# Patient Record
Sex: Male | Born: 1965 | Race: White | Hispanic: No | Marital: Single | Smoking: Current every day smoker
Health system: Southern US, Community
[De-identification: ages and names within clinical notes are randomized; demographics above are authoritative.]

## PROBLEM LIST (undated history)

## (undated) DIAGNOSIS — I10 Essential (primary) hypertension: Secondary | ICD-10-CM

## (undated) DIAGNOSIS — J189 Pneumonia, unspecified organism: Secondary | ICD-10-CM

## (undated) DIAGNOSIS — J984 Other disorders of lung: Secondary | ICD-10-CM

## (undated) DIAGNOSIS — I5020 Unspecified systolic (congestive) heart failure: Secondary | ICD-10-CM

## (undated) DIAGNOSIS — J45909 Unspecified asthma, uncomplicated: Secondary | ICD-10-CM

## (undated) DIAGNOSIS — E785 Hyperlipidemia, unspecified: Secondary | ICD-10-CM

## (undated) DIAGNOSIS — I2510 Atherosclerotic heart disease of native coronary artery without angina pectoris: Secondary | ICD-10-CM

## (undated) DIAGNOSIS — Z720 Tobacco use: Secondary | ICD-10-CM

## (undated) DIAGNOSIS — I255 Ischemic cardiomyopathy: Secondary | ICD-10-CM

## (undated) HISTORY — DX: Pneumonia, unspecified organism: J18.9

## (undated) HISTORY — PX: OTHER SURGICAL HISTORY: SHX169

## (undated) HISTORY — DX: Atherosclerotic heart disease of native coronary artery without angina pectoris: I25.10

## (undated) HISTORY — DX: Tobacco use: Z72.0

## (undated) HISTORY — DX: Unspecified systolic (congestive) heart failure: I50.20

## (undated) HISTORY — DX: Hyperlipidemia, unspecified: E78.5

## (undated) HISTORY — DX: Ischemic cardiomyopathy: I25.5

## (undated) HISTORY — DX: Other disorders of lung: J98.4

## (undated) HISTORY — DX: Essential (primary) hypertension: I10

---

## 2016-03-01 ENCOUNTER — Emergency Department (HOSPITAL_COMMUNITY)
Admission: EM | Admit: 2016-03-01 | Discharge: 2016-03-01 | Disposition: A | Discharge status: Home / Self Care | Payer: Self-pay | Attending: Emergency Medicine | Admitting: Emergency Medicine

## 2016-03-01 ENCOUNTER — Encounter (HOSPITAL_COMMUNITY): Payer: Self-pay

## 2016-03-01 DIAGNOSIS — Z23 Encounter for immunization: Secondary | ICD-10-CM | POA: Insufficient documentation

## 2016-03-01 DIAGNOSIS — Y939 Activity, unspecified: Secondary | ICD-10-CM | POA: Insufficient documentation

## 2016-03-01 DIAGNOSIS — Y999 Unspecified external cause status: Secondary | ICD-10-CM | POA: Insufficient documentation

## 2016-03-01 DIAGNOSIS — Y929 Unspecified place or not applicable: Secondary | ICD-10-CM | POA: Insufficient documentation

## 2016-03-01 DIAGNOSIS — W2203XA Walked into furniture, initial encounter: Secondary | ICD-10-CM | POA: Insufficient documentation

## 2016-03-01 DIAGNOSIS — F17210 Nicotine dependence, cigarettes, uncomplicated: Secondary | ICD-10-CM | POA: Insufficient documentation

## 2016-03-01 DIAGNOSIS — S0191XA Laceration without foreign body of unspecified part of head, initial encounter: Secondary | ICD-10-CM

## 2016-03-01 DIAGNOSIS — S0181XA Laceration without foreign body of other part of head, initial encounter: Secondary | ICD-10-CM | POA: Insufficient documentation

## 2016-03-01 MED ORDER — BACITRACIN ZINC 500 UNIT/GM EX OINT: 1.0000 "application " | TOPICAL_OINTMENT | Freq: Two times a day (BID) | CUTANEOUS | 0 refills | Status: DC

## 2016-03-01 MED ORDER — STERILE WATER FOR INJECTION IJ SOLN
INTRAMUSCULAR | Status: AC
  Filled 2016-03-01: qty 10

## 2016-03-01 MED ORDER — LIDOCAINE-EPINEPHRINE (PF) 2 %-1:200000 IJ SOLN
20.0000 mL | Freq: Once | INTRAMUSCULAR | Status: AC
  Administered 2016-03-01: 20 mL via INTRADERMAL
  Filled 2016-03-01: qty 20

## 2016-03-01 MED ORDER — TETANUS-DIPHTH-ACELL PERTUSSIS 5-2.5-18.5 LF-MCG/0.5 IM SUSP
0.5000 mL | Freq: Once | INTRAMUSCULAR | Status: AC
  Administered 2016-03-01: 0.5 mL via INTRAMUSCULAR
  Filled 2016-03-01: qty 0.5
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
## 2016-03-01 NOTE — ED Notes (Signed)
Patient given discharge papers as he was walking out; told to follow up; did not want to wait for bacitracin application, ambulatory from department with family members.

## 2018-03-15 ENCOUNTER — Emergency Department: Payer: Self-pay

## 2018-03-15 ENCOUNTER — Other Ambulatory Visit: Payer: Self-pay

## 2018-03-15 ENCOUNTER — Encounter: Payer: Self-pay | Admitting: Emergency Medicine

## 2018-03-15 ENCOUNTER — Inpatient Hospital Stay
Admission: EM | Admit: 2018-03-15 | Discharge: 2018-03-19 | DRG: 246 | Disposition: A | Discharge status: Home / Self Care | Payer: Self-pay | Attending: Internal Medicine | Admitting: Internal Medicine

## 2018-03-15 DIAGNOSIS — R7989 Other specified abnormal findings of blood chemistry: Secondary | ICD-10-CM

## 2018-03-15 DIAGNOSIS — I25110 Atherosclerotic heart disease of native coronary artery with unstable angina pectoris: Secondary | ICD-10-CM | POA: Diagnosis present

## 2018-03-15 DIAGNOSIS — J984 Other disorders of lung: Secondary | ICD-10-CM

## 2018-03-15 DIAGNOSIS — E785 Hyperlipidemia, unspecified: Secondary | ICD-10-CM | POA: Diagnosis present

## 2018-03-15 DIAGNOSIS — E876 Hypokalemia: Secondary | ICD-10-CM | POA: Diagnosis present

## 2018-03-15 DIAGNOSIS — R739 Hyperglycemia, unspecified: Secondary | ICD-10-CM | POA: Diagnosis present

## 2018-03-15 DIAGNOSIS — Z720 Tobacco use: Secondary | ICD-10-CM | POA: Diagnosis present

## 2018-03-15 DIAGNOSIS — D649 Anemia, unspecified: Secondary | ICD-10-CM | POA: Diagnosis present

## 2018-03-15 DIAGNOSIS — F17210 Nicotine dependence, cigarettes, uncomplicated: Secondary | ICD-10-CM | POA: Diagnosis present

## 2018-03-15 DIAGNOSIS — R778 Other specified abnormalities of plasma proteins: Secondary | ICD-10-CM

## 2018-03-15 DIAGNOSIS — I214 Non-ST elevation (NSTEMI) myocardial infarction: Principal | ICD-10-CM

## 2018-03-15 DIAGNOSIS — Z23 Encounter for immunization: Secondary | ICD-10-CM

## 2018-03-15 DIAGNOSIS — Z8249 Family history of ischemic heart disease and other diseases of the circulatory system: Secondary | ICD-10-CM

## 2018-03-15 DIAGNOSIS — E46 Unspecified protein-calorie malnutrition: Secondary | ICD-10-CM | POA: Diagnosis present

## 2018-03-15 DIAGNOSIS — I700 Atherosclerosis of aorta: Secondary | ICD-10-CM | POA: Diagnosis present

## 2018-03-15 DIAGNOSIS — J45909 Unspecified asthma, uncomplicated: Secondary | ICD-10-CM | POA: Diagnosis present

## 2018-03-15 DIAGNOSIS — Z789 Other specified health status: Secondary | ICD-10-CM

## 2018-03-15 DIAGNOSIS — Z77120 Contact with and (suspected) exposure to mold (toxic): Secondary | ICD-10-CM

## 2018-03-15 DIAGNOSIS — J188 Other pneumonia, unspecified organism: Secondary | ICD-10-CM | POA: Diagnosis present

## 2018-03-15 DIAGNOSIS — R079 Chest pain, unspecified: Secondary | ICD-10-CM | POA: Diagnosis present

## 2018-03-15 HISTORY — DX: Unspecified asthma, uncomplicated: J45.909

## 2018-03-15 LAB — BASIC METABOLIC PANEL
ANION GAP: 10 (ref 5–15)
BUN: 10 mg/dL (ref 6–20)
CALCIUM: 10 mg/dL (ref 8.9–10.3)
CO2: 28 mmol/L (ref 22–32)
Chloride: 99 mmol/L (ref 98–111)
Creatinine, Ser: 0.85 mg/dL (ref 0.61–1.24)
Glucose, Bld: 143 mg/dL — ABNORMAL HIGH (ref 70–99)
Potassium: 3.5 mmol/L (ref 3.5–5.1)
SODIUM: 137 mmol/L (ref 135–145)

## 2018-03-15 LAB — CBC
HCT: 42.8 % (ref 39.0–52.0)
Hemoglobin: 14.3 g/dL (ref 13.0–17.0)
MCH: 31.2 pg (ref 26.0–34.0)
MCHC: 33.4 g/dL (ref 30.0–36.0)
MCV: 93.2 fL (ref 80.0–100.0)
NRBC: 0 % (ref 0.0–0.2)
PLATELETS: 545 10*3/uL — AB (ref 150–400)
RBC: 4.59 MIL/uL (ref 4.22–5.81)
RDW: 13.2 % (ref 11.5–15.5)
WBC: 13.6 10*3/uL — ABNORMAL HIGH (ref 4.0–10.5)

## 2018-03-15 LAB — PROTIME-INR
INR: 1.15
PROTHROMBIN TIME: 14.6 s (ref 11.4–15.2)

## 2018-03-15 LAB — TROPONIN I: TROPONIN I: 0.19 ng/mL — AB (ref <0.03)

## 2018-03-15 LAB — LACTIC ACID, PLASMA: Lactic Acid, Venous: 1 mmol/L (ref 0.5–1.9)

## 2018-03-15 LAB — APTT: APTT: 34 s (ref 24–36)

## 2018-03-15 MED ORDER — VANCOMYCIN HCL IN DEXTROSE 1-5 GM/200ML-% IV SOLN
1000.0000 mg | Freq: Two times a day (BID) | INTRAVENOUS | Status: DC
  Administered 2018-03-16 (×2): 1000 mg via INTRAVENOUS
  Filled 2018-03-15 (×3): qty 200

## 2018-03-15 MED ORDER — SODIUM CHLORIDE 0.9 % IV BOLUS
500.0000 mL | Freq: Once | INTRAVENOUS | Status: AC
Start: 2018-03-15 — End: 2018-03-15
  Administered 2018-03-15: 500 mL via INTRAVENOUS

## 2018-03-15 MED ORDER — VANCOMYCIN HCL IN DEXTROSE 1-5 GM/200ML-% IV SOLN
1000.0000 mg | Freq: Once | INTRAVENOUS | Status: AC
  Administered 2018-03-15: 1000 mg via INTRAVENOUS
  Filled 2018-03-15: qty 200

## 2018-03-15 MED ORDER — ASPIRIN 81 MG PO CHEW
324.0000 mg | CHEWABLE_TABLET | Freq: Once | ORAL | Status: AC
Start: 2018-03-15 — End: 2018-03-15
  Administered 2018-03-15: 324 mg via ORAL
  Filled 2018-03-15: qty 4

## 2018-03-15 MED ORDER — IOPAMIDOL (ISOVUE-370) INJECTION 76%
75.0000 mL | Freq: Once | INTRAVENOUS | Status: AC | PRN
  Administered 2018-03-15: 75 mL via INTRAVENOUS

## 2018-03-15 MED ORDER — HEPARIN (PORCINE) IN NACL 100-0.45 UNIT/ML-% IJ SOLN
1700.0000 [IU]/h | INTRAMUSCULAR | Status: DC
  Administered 2018-03-15: 1200 [IU]/h via INTRAVENOUS
  Administered 2018-03-16: 1500 [IU]/h via INTRAVENOUS
  Administered 2018-03-17: 1600 [IU]/h via INTRAVENOUS
  Filled 2018-03-15 (×9): qty 250

## 2018-03-15 MED ORDER — AZITHROMYCIN 500 MG IV SOLR
500.0000 mg | INTRAVENOUS | Status: DC
  Administered 2018-03-15 – 2018-03-17 (×3): 500 mg via INTRAVENOUS
  Filled 2018-03-15 (×5): qty 500

## 2018-03-15 MED ORDER — HEPARIN BOLUS VIA INFUSION
4000.0000 [IU] | Freq: Once | INTRAVENOUS | Status: AC
  Administered 2018-03-15: 4000 [IU] via INTRAVENOUS
  Filled 2018-03-15: qty 4000

## 2018-03-15 MED ORDER — FENTANYL CITRATE (PF) 100 MCG/2ML IJ SOLN
50.0000 ug | INTRAMUSCULAR | Status: DC | PRN
Start: 2018-03-15 — End: 2018-03-16
  Administered 2018-03-15: 50 ug via INTRAVENOUS
  Filled 2018-03-15: qty 2

## 2018-03-15 MED ORDER — SODIUM CHLORIDE 0.9 % IV SOLN
2.0000 g | INTRAVENOUS | Status: DC
  Administered 2018-03-15 – 2018-03-18 (×4): 2 g via INTRAVENOUS
  Filled 2018-03-15: qty 20
  Filled 2018-03-15 (×3): qty 2
  Filled 2018-03-15 (×2): qty 20

## 2018-03-15 MED ORDER — NITROGLYCERIN 0.4 MG SL SUBL
0.4000 mg | SUBLINGUAL_TABLET | SUBLINGUAL | Status: DC | PRN
  Administered 2018-03-15 – 2018-03-17 (×2): 0.4 mg via SUBLINGUAL
  Filled 2018-03-15 (×2): qty 1

## 2018-03-15 MED ORDER — SODIUM CHLORIDE 0.9 % IV SOLN: 2.0000 g | INTRAVENOUS | Status: DC

## 2018-03-15 NOTE — ED Notes (Signed)
Patient transported to CT at this time. 

## 2018-03-15 NOTE — ED Notes (Signed)
Pt refusing additional doses of SL Nitro after 1 tablet. Pt states it did not help his pain at all and is starting to give him a headache. Pt informed that this is a common side effect and we could give him medications for the headache, but pt still refuses to take additional doses.

## 2018-03-15 NOTE — ED Notes (Signed)
Only 1 set of blood cultures collected d/t importance of starting ordered IV antibiotics ASAP.

## 2018-03-15 NOTE — ED Triage Notes (Signed)
Pt reports that he developed mid sternal and epigastric pain that began at 1400 today. He states that it burns, does not radiate anywhere. Had N/V, denies diapheresis, or SOB. He reports that he ate some tums but threw them up.

## 2018-03-15 NOTE — Progress Notes (Signed)
ANTICOAGULATION CONSULT NOTE - Initial Consult  Pharmacy Consult for Heparin  Indication: chest pain/ACS  No Known Allergies  Patient Measurements: Height: 5\' 6"  (167.6 cm) Weight: 145 lb (65.8 kg) IBW/kg (Calculated) : 63.8 Heparin Dosing Weight:  65.8 kg   Vital Signs: Temp: 98.2 F (36.8 C) (11/05 1703) Temp Source: Oral (11/05 1703) BP: 133/94 (11/05 2051) Pulse Rate: 91 (11/05 2051)  Labs: Recent Labs    03/15/18 1705  HGB 14.3  HCT 42.8  PLT 545*  CREATININE 0.85  TROPONINI 0.19*    Estimated Creatinine Clearance: 93.8 mL/min (by C-G formula based on SCr of 0.85 mg/dL).   Medical History: Past Medical History:  Diagnosis Date  . Asthma     Medications:   (Not in a hospital admission)  Assessment: Pharmacy consulted to dose heparin in this 52 year old male with ACS/NSTEMI.   CrCl = 93.8 ml/min No prior anticoag noted.  Goal of Therapy:  Heparin level 0.3-0.7 units/ml Monitor platelets by anticoagulation protocol: Yes   Plan:  Give 4000 units bolus x 1 Start heparin infusion at 1200 units/hr Check anti-Xa level in 6 hours and daily while on heparin Continue to monitor H&H and platelets  Hoang Pettingill D 03/15/2018,9:32 PM

## 2018-03-15 NOTE — ED Provider Notes (Signed)
[facility] Emergency Department Provider Note    First MD Initiated Contact with Patient 03/15/18 1953     (approximate)  I have reviewed the triage vital signs and the nursing notes.   HISTORY  Chief Complaint Chest Pain    HPI Keith Warner is a 52 y.o. male with a history of chronic daily cough presents the ER with worsening midsternal and epigastric discomfort that started around 2:00 today.  Did have some worsening shortness of breath and discomfort when taking a deep inspiration.  Also describes the pain is pressure-like someone is sitting on his chest.  Denies any diaphoresis.  No recent fevers.  Did try taking amoxicillin as he thought that he is developing pneumonia.  Was unable to keep those down  secondary to nausea and vomiting.  States he still having some vague discomfort at this time.  Is never had pain like this before.   Past Medical History:  Diagnosis Date  . Asthma    History reviewed. No pertinent family history. Past Surgical History:  Procedure Laterality Date  . gangrene     There are no active problems to display for this patient.     Prior to Admission medications   Medication Sig Start Date End Date Taking? Authorizing Provider  albuterol (PROVENTIL HFA;VENTOLIN HFA) 108 (90 Base) MCG/ACT inhaler Inhale 1 puff into the lungs every 6 (six) hours as needed for wheezing. 02/27/18  Yes [provider]  bacitracin ointment Apply 1 application topically 2 (two) times daily. Patient not taking: Reported on 03/15/2018 03/01/16   Francoise Ceo, DO    Allergies Patient has no known allergies.    Social History Social History   Tobacco Use  . Smoking status: Current Every Day Smoker    Types: Cigarettes  . Smokeless tobacco: Never Used  Substance Use Topics  . Alcohol use: Yes  . Drug use: Not on file    Review of Systems Patient denies headaches, rhinorrhea, blurry vision, numbness, shortness of  breath, chest pain, edema, cough, abdominal pain, nausea, vomiting, diarrhea, dysuria, fevers, rashes or hallucinations unless otherwise stated above in HPI. ____________________________________________   PHYSICAL EXAM:  VITAL SIGNS: Vitals:   03/15/18 2209 03/15/18 2300  BP: (!) 129/99 102/88  Pulse: 91 81  Resp: 20 18  Temp:    SpO2: 99% 97%    Constitutional: Alert and oriented.  Eyes: Conjunctivae are normal.  Head: Atraumatic. Nose: No congestion/rhinnorhea. Mouth/Throat: Mucous membranes are moist.   Neck: No stridor. Painless ROM.  Cardiovascular: Normal rate, regular rhythm. Grossly normal heart sounds.  Good peripheral circulation. Respiratory: Normal respiratory effort.  No retractions. Lungs CTAB. Gastrointestinal: Soft and nontender. No distention. No abdominal bruits. No CVA tenderness. Genitourinary:  Musculoskeletal: No lower extremity tenderness nor edema.  No joint effusions. Neurologic:  Normal speech and language. No gross focal neurologic deficits are appreciated. No facial droop Skin:  Skin is warm, dry and intact. No rash noted. Psychiatric: Mood and affect are normal. Speech and behavior are normal.  ____________________________________________   LABS (all labs ordered are listed, but only abnormal results are displayed)  Results for orders placed or performed during the hospital encounter of 03/15/18 (from the past 24 hour(s))  Basic metabolic panel     Status: Abnormal   Collection Time: 03/15/18  5:05 PM  Result Value Ref Range   Sodium 137 135 - 145 mmol/L   Potassium 3.5 3.5 - 5.1 mmol/L   Chloride 99 98 - 111  mmol/L   CO2 28 22 - 32 mmol/L   Glucose, Bld 143 (H) 70 - 99 mg/dL   BUN 10 6 - 20 mg/dL   Creatinine, Ser 1.61 0.61 - 1.24 mg/dL   Calcium 09.6 8.9 - 04.5 mg/dL   GFR calc non Af Amer >60 >60 mL/min   GFR calc Af Amer >60 >60 mL/min   Anion gap 10 5 - 15  CBC     Status: Abnormal   Collection Time: 03/15/18  5:05 PM  Result  Value Ref Range   WBC 13.6 (H) 4.0 - 10.5 K/uL   RBC 4.59 4.22 - 5.81 MIL/uL   Hemoglobin 14.3 13.0 - 17.0 g/dL   HCT 40.9 81.1 - 91.4 %   MCV 93.2 80.0 - 100.0 fL   MCH 31.2 26.0 - 34.0 pg   MCHC 33.4 30.0 - 36.0 g/dL   RDW 78.2 95.6 - 21.3 %   Platelets 545 (H) 150 - 400 K/uL   nRBC 0.0 0.0 - 0.2 %  Troponin I     Status: Abnormal   Collection Time: 03/15/18  5:05 PM  Result Value Ref Range   Troponin I 0.19 (HH) <0.03 ng/mL  Lactic acid, plasma     Status: None   Collection Time: 03/15/18  9:28 PM  Result Value Ref Range   Lactic Acid, Venous 1.0 0.5 - 1.9 mmol/L  Protime-INR     Status: None   Collection Time: 03/15/18  9:39 PM  Result Value Ref Range   Prothrombin Time 14.6 11.4 - 15.2 seconds   INR 1.15   APTT     Status: None   Collection Time: 03/15/18  9:39 PM  Result Value Ref Range   aPTT 34 24 - 36 seconds   ____________________________________________  EKG My review and personal interpretation at Time: 16:56   Indication: chest pain  Rate: 95  Rhythm: sinus Axis: normal Other: inferolateral st depression, no stemi, abn ekg ____________________________________________  RADIOLOGY  I personally reviewed all radiographic images ordered to evaluate for the above acute complaints and reviewed radiology reports and findings.  These findings were personally discussed with the patient.  Please see medical record for radiology report.  ____________________________________________   PROCEDURES  Procedure(s) performed:  .Critical Care Performed by: Willy Eddy, MD Authorized by: Willy Eddy, MD   Critical care provider statement:    Critical care time (minutes):  35   Critical care time was exclusive of:  Separately billable procedures and treating other patients   Critical care was necessary to treat or prevent imminent or life-threatening deterioration of the following conditions:  Cardiac failure   Critical care was time spent personally by me on  the following activities:  Development of treatment plan with patient or surrogate, discussions with consultants, evaluation of patient's response to treatment, examination of patient, obtaining history from patient or surrogate, ordering and performing treatments and interventions, ordering and review of laboratory studies, ordering and review of radiographic studies, pulse oximetry, re-evaluation of patient's condition and review of old charts      Critical Care performed: yes ____________________________________________   INITIAL IMPRESSION / ASSESSMENT AND PLAN / ED COURSE  Pertinent labs & imaging results that were available during my care of the patient were reviewed by me and considered in my medical decision making (see chart for details).   DDX: ACS, pericarditis, esophagitis, boerhaaves, pe, dissection, pna, bronchitis, costochondritis   Keith Warner is a 52 y.o. who presents to the ED with symptoms  as described above.  Patient arrives afebrile mildly hypertensive but no respiratory distress.  EKG does show some inferolateral ST changes but no STEMI criteria.  Initial troponin is elevated concerning for an STEMI but does have leukocytosis and given his coughs could be demand ischemia.  Given his significant smoking history with possible cavitary lesion on chest x-ray CT imaging will be ordered to exclude PE and malignancy.  Will provide IV pain medication as well as IV fluids.  Patient will require hospitalization for further medical work-up.  Clinical Course as of Mar 16 2355  Tue Mar 15, 2018  2104 CT angiogram does show evidence of probable cavitary pneumonia.  Will start on broad-spectrum antibiotics.  Do feel troponin elevation more likely demand ischemia in his current setting.   [PR]  2127 I discussed the case with Dr. and of cardiology.  He has recommended continuing the treatment for ACS with aspirin and heparinization the patient will likely require catheterization.   Remains stable at this time.  He is pain-Warner.  Discussed the case with Dr. Anne Hahn of hospitalist group, agrees to admit patient for further medical management.   [PR]    Clinical Course User Index [PR] Willy Eddy, MD     As part of my medical decision making, I reviewed the following data within the electronic MEDICAL RECORD NUMBER Nursing notes reviewed and incorporated, Labs reviewed, notes from prior ED visits.  ____________________________________________   FINAL CLINICAL IMPRESSION(S) / ED DIAGNOSES  Final diagnoses:  Cavitary lesion of lung  Chest pain, unspecified type  Elevated troponin I level      NEW MEDICATIONS STARTED DURING THIS VISIT:  New Prescriptions   No medications on file     Note:  This document was prepared using Dragon voice recognition software and may include unintentional dictation errors.    Willy Eddy, MD 03/15/18 2356

## 2018-03-16 ENCOUNTER — Inpatient Hospital Stay
Admit: 2018-03-16 | Discharge: 2018-03-16 | Disposition: A | Discharge status: Home / Self Care | Payer: Self-pay | Attending: Internal Medicine | Admitting: Internal Medicine

## 2018-03-16 ENCOUNTER — Encounter: Payer: Self-pay | Admitting: Internal Medicine

## 2018-03-16 DIAGNOSIS — F1011 Alcohol abuse, in remission: Secondary | ICD-10-CM

## 2018-03-16 DIAGNOSIS — I214 Non-ST elevation (NSTEMI) myocardial infarction: Principal | ICD-10-CM

## 2018-03-16 DIAGNOSIS — Z720 Tobacco use: Secondary | ICD-10-CM

## 2018-03-16 DIAGNOSIS — J181 Lobar pneumonia, unspecified organism: Secondary | ICD-10-CM

## 2018-03-16 DIAGNOSIS — J45909 Unspecified asthma, uncomplicated: Secondary | ICD-10-CM | POA: Diagnosis present

## 2018-03-16 DIAGNOSIS — R079 Chest pain, unspecified: Secondary | ICD-10-CM | POA: Diagnosis present

## 2018-03-16 DIAGNOSIS — R0602 Shortness of breath: Secondary | ICD-10-CM

## 2018-03-16 DIAGNOSIS — J984 Other disorders of lung: Secondary | ICD-10-CM

## 2018-03-16 DIAGNOSIS — Z77120 Contact with and (suspected) exposure to mold (toxic): Secondary | ICD-10-CM

## 2018-03-16 DIAGNOSIS — F17210 Nicotine dependence, cigarettes, uncomplicated: Secondary | ICD-10-CM

## 2018-03-16 DIAGNOSIS — R918 Other nonspecific abnormal finding of lung field: Secondary | ICD-10-CM

## 2018-03-16 DIAGNOSIS — R0789 Other chest pain: Secondary | ICD-10-CM

## 2018-03-16 LAB — LIPID PANEL
CHOL/HDL RATIO: 4.6 ratio
CHOLESTEROL: 160 mg/dL (ref 0–200)
HDL: 35 mg/dL — AB (ref >40)
LDL Cholesterol: 106 mg/dL — ABNORMAL HIGH (ref 0–99)
Triglycerides: 93 mg/dL (ref <150)
VLDL: 19 mg/dL (ref 0–40)

## 2018-03-16 LAB — HEPATIC FUNCTION PANEL
ALK PHOS: 54 U/L (ref 38–126)
ALT: 11 U/L (ref 0–44)
AST: 36 U/L (ref 15–41)
Albumin: 3 g/dL — ABNORMAL LOW (ref 3.5–5.0)
Total Bilirubin: 1 mg/dL (ref 0.3–1.2)
Total Protein: 7.1 g/dL (ref 6.5–8.1)

## 2018-03-16 LAB — TROPONIN I
TROPONIN I: 5.66 ng/mL — AB (ref <0.03)
TROPONIN I: 6.21 ng/mL — AB (ref <0.03)
TROPONIN I: 4.61 ng/mL — AB (ref <0.03)
Troponin I: 3.82 ng/mL (ref <0.03)

## 2018-03-16 LAB — ECHOCARDIOGRAM COMPLETE
Height: 66 in
Weight: 2163.2 oz

## 2018-03-16 LAB — BASIC METABOLIC PANEL
Anion gap: 10 (ref 5–15)
BUN: 8 mg/dL (ref 6–20)
CALCIUM: 9.2 mg/dL (ref 8.9–10.3)
CHLORIDE: 102 mmol/L (ref 98–111)
CO2: 26 mmol/L (ref 22–32)
Creatinine, Ser: 0.65 mg/dL (ref 0.61–1.24)
Glucose, Bld: 113 mg/dL — ABNORMAL HIGH (ref 70–99)
Potassium: 3.8 mmol/L (ref 3.5–5.1)
SODIUM: 138 mmol/L (ref 135–145)

## 2018-03-16 LAB — CBC
HEMATOCRIT: 37.6 % — AB (ref 39.0–52.0)
Hemoglobin: 12.6 g/dL — ABNORMAL LOW (ref 13.0–17.0)
MCH: 31.1 pg (ref 26.0–34.0)
MCHC: 33.5 g/dL (ref 30.0–36.0)
MCV: 92.8 fL (ref 80.0–100.0)
NRBC: 0 % (ref 0.0–0.2)
PLATELETS: 504 10*3/uL — AB (ref 150–400)
RBC: 4.05 MIL/uL — ABNORMAL LOW (ref 4.22–5.81)
RDW: 13.2 % (ref 11.5–15.5)
WBC: 10.5 10*3/uL (ref 4.0–10.5)

## 2018-03-16 LAB — MRSA PCR SCREENING: MRSA by PCR: NEGATIVE

## 2018-03-16 LAB — HEPARIN LEVEL (UNFRACTIONATED)
HEPARIN UNFRACTIONATED: 0.16 [IU]/mL — AB (ref 0.30–0.70)
HEPARIN UNFRACTIONATED: 0.12 [IU]/mL — AB (ref 0.30–0.70)
Heparin Unfractionated: 0.35 IU/mL (ref 0.30–0.70)
Heparin Unfractionated: 0.2 IU/mL — ABNORMAL LOW (ref 0.30–0.70)

## 2018-03-16 LAB — HEMOGLOBIN A1C
Hgb A1c MFr Bld: 5.9 % — ABNORMAL HIGH (ref 4.8–5.6)
Mean Plasma Glucose: 122.63 mg/dL

## 2018-03-16 LAB — MAGNESIUM: Magnesium: 1.9 mg/dL (ref 1.7–2.4)

## 2018-03-16 MED ORDER — INFLUENZA VAC SPLIT QUAD 0.5 ML IM SUSY
0.5000 mL | PREFILLED_SYRINGE | INTRAMUSCULAR | Status: AC
  Administered 2018-03-17: 0.5 mL via INTRAMUSCULAR
  Filled 2018-03-16: qty 0.5

## 2018-03-16 MED ORDER — ASPIRIN EC 81 MG PO TBEC
81.0000 mg | DELAYED_RELEASE_TABLET | Freq: Every day | ORAL | Status: DC
  Administered 2018-03-16 – 2018-03-19 (×4): 81 mg via ORAL
  Filled 2018-03-16 (×4): qty 1

## 2018-03-16 MED ORDER — ONDANSETRON HCL 4 MG/2ML IJ SOLN: 4.0000 mg | Freq: Four times a day (QID) | INTRAMUSCULAR | Status: DC | PRN

## 2018-03-16 MED ORDER — ACETAMINOPHEN 650 MG RE SUPP: 650.0000 mg | Freq: Four times a day (QID) | RECTAL | Status: DC | PRN

## 2018-03-16 MED ORDER — ALBUTEROL SULFATE (2.5 MG/3ML) 0.083% IN NEBU: 2.5000 mg | INHALATION_SOLUTION | Freq: Four times a day (QID) | RESPIRATORY_TRACT | Status: DC | PRN

## 2018-03-16 MED ORDER — METRONIDAZOLE IN NACL 5-0.79 MG/ML-% IV SOLN
500.0000 mg | Freq: Three times a day (TID) | INTRAVENOUS | Status: DC
  Administered 2018-03-16 – 2018-03-19 (×8): 500 mg via INTRAVENOUS
  Filled 2018-03-16 (×11): qty 100

## 2018-03-16 MED ORDER — PNEUMOCOCCAL VAC POLYVALENT 25 MCG/0.5ML IJ INJ
0.5000 mL | INJECTION | INTRAMUSCULAR | Status: AC
  Administered 2018-03-17: 0.5 mL via INTRAMUSCULAR
  Filled 2018-03-16: qty 0.5

## 2018-03-16 MED ORDER — ACETAMINOPHEN 325 MG PO TABS: 650.0000 mg | ORAL_TABLET | Freq: Four times a day (QID) | ORAL | Status: DC | PRN

## 2018-03-16 MED ORDER — HEPARIN BOLUS VIA INFUSION
2000.0000 [IU] | Freq: Once | INTRAVENOUS | Status: AC
  Administered 2018-03-16: 2000 [IU] via INTRAVENOUS
  Filled 2018-03-16: qty 2000

## 2018-03-16 MED ORDER — ONDANSETRON HCL 4 MG PO TABS: 4.0000 mg | ORAL_TABLET | Freq: Four times a day (QID) | ORAL | Status: DC | PRN

## 2018-03-16 MED ORDER — POTASSIUM CHLORIDE CRYS ER 10 MEQ PO TBCR
10.0000 meq | EXTENDED_RELEASE_TABLET | Freq: Two times a day (BID) | ORAL | Status: AC
  Administered 2018-03-16: 10 meq via ORAL
  Filled 2018-03-16: qty 1

## 2018-03-16 MED ORDER — HEPARIN BOLUS VIA INFUSION
1800.0000 [IU] | Freq: Once | INTRAVENOUS | Status: AC
  Administered 2018-03-16: 1800 [IU] via INTRAVENOUS
  Filled 2018-03-16: qty 1800

## 2018-03-16 NOTE — Consult Note (Addendum)
[facility] [place] [facility] Medicine Consultation      Assessment and Plan:  Right upper lobe cavitary pneumonia. - Appeared to coincide with working removing mold underneath a home. Suspicious for necrotizing pneumonia, possible atypical and fungal pneumonia are also in the differential.. --Given patient's elevation and troponins, with an NSTEMI, patient patient would not be a candidate for bronchoscopy at this time. --Will check TB quantiferon, sputum AFB. - Continue empiric antibiotic treatment. - Patient will need further outpatient imaging follow-up to ensure resolution.  Chest pain.  --Continue cardiac workup.  Nicotine abuse. -Currently smoking 1 pack/day, however given this recent episode, he is considering quitting.  Discussed importance of smoke cessation.   Date: 03/16/2018  MRN# [id number] Keith Warner [birth year]  Referring Physician: Dr. Cherlynn Kaiser for pneumonia.   Keith Warner is a 52 y.o. old male seen in consultation for chief complaint of:    Chief Complaint  Patient presents with  . Chest Pain    HPI:  Patient is a 52 year old male presents with progressive midsternal comfort and chronic cough.  He was taking Tums due to chest discomfort and presumed heartburn.  Upon arrival in the ED he was given a sublingual nitroglycerin with some improvement.  He does report intermittent night sweats and fatigue for the last 1 month, he has been working underneath his uncles house cleaning out mold for the last month. Troponin was elevated in ED at 0.19.  Subsequently increased to 5.66.  CT chest 03/15/2018>> imaging personally reviewed, there is a cavitary mass seen in the right upper lobe with surrounding area of infiltrate.   PMHX:   Past Medical History:  Diagnosis Date  . Asthma    Surgical Hx:  Past Surgical History:  Procedure Laterality Date  . gangrene     Family Hx:  Family History  Problem Relation Age of Onset  . CAD Mother    Social Hx:     Social History   Tobacco Use  . Smoking status: Current Every Day Smoker    Packs/day: 1.00    Years: 35.00    Pack years: 35.00    Types: Cigarettes  . Smokeless tobacco: Never Used  Substance Use Topics  . Alcohol use: Yes  . Drug use: Not on file   Medication:    Current Facility-Administered Medications:  .  acetaminophen (TYLENOL) tablet 650 mg, 650 mg, Oral, Q6H PRN **OR** acetaminophen (TYLENOL) suppository 650 mg, 650 mg, Rectal, Q6H PRN, Oralia Manis, MD .  albuterol (PROVENTIL) (2.5 MG/3ML) 0.083% nebulizer solution 2.5 mg, 2.5 mg, Inhalation, Q6H PRN, Oralia Manis, MD .  aspirin EC tablet 81 mg, 81 mg, Oral, Daily, Oralia Manis, MD, 81 mg at 03/16/18 1130 .  azithromycin (ZITHROMAX) 500 mg in sodium chloride 0.9 % 250 mL IVPB, 500 mg, Intravenous, Q24H, Oralia Manis, MD, Stopped at 03/16/18 0026 .  cefTRIAXone (ROCEPHIN) 2 g in sodium chloride 0.9 % 100 mL IVPB, 2 g, Intravenous, Q24H, Oralia Manis, MD, Stopped at 03/15/18 2222 .  heparin ADULT infusion 100 units/mL (25000 units/266mL sodium chloride 0.45%), 1,500 Units/hr, Intravenous, Continuous, Ellington, Abby K, RPH, Last Rate: 15 mL/hr at 03/16/18 1131, 1,500 Units/hr at 03/16/18 1131 .  [START ON 03/17/2018] Influenza vac split quadrivalent PF (FLUARIX) injection 0.5 mL, 0.5 mL, Intramuscular, Tomorrow-1000, Oralia Manis, MD .  nitroGLYCERIN (NITROSTAT) SL tablet 0.4 mg, 0.4 mg, Sublingual, Q5 min PRN, Oralia Manis, MD, 0.4 mg at 03/15/18 2208 .  ondansetron (ZOFRAN) tablet 4 mg, 4 mg, Oral, Q6H PRN **OR**  ondansetron (ZOFRAN) injection 4 mg, 4 mg, Intravenous, Q6H PRN, Oralia Manis, MD .  Melene Muller ON 03/17/2018] pneumococcal 23 valent vaccine (PNU-IMMUNE) injection 0.5 mL, 0.5 mL, Intramuscular, Tomorrow-1000, Oralia Manis, MD .  potassium chloride (K-DUR,KLOR-CON) CR tablet 10 mEq, 10 mEq, Oral, BID, Visser, Jacquelyn D, PA-C .  vancomycin (VANCOCIN) IVPB 1000 mg/200 mL premix, 1,000 mg, Intravenous, Q12H,  Oralia Manis, MD, Last Rate: 200 mL/hr at 03/16/18 0414, 1,000 mg at 03/16/18 0414   Allergies:  Patient has no known allergies.  Review of Systems: Gen:  Denies  fever, sweats, chills HEENT: Denies blurred vision, double vision. bleeds, sore throat Cvc:  No dizziness, chest pain. Resp:   Denies cough or sputum production, shortness of breath Gi: Denies swallowing difficulty, stomach pain. Gu:  Denies bladder incontinence, burning urine Ext:   No Joint pain, stiffness. Skin: No skin rash,  hives  Endoc:  No polyuria, polydipsia. Psych: No depression, insomnia. Other:  All other systems were reviewed with the patient and were negative other that what is mentioned in the HPI.   Physical Examination:   VS: BP 107/82 (BP Location: Right Arm)   Pulse 86   Temp 98.5 F (36.9 C) (Oral)   Resp 19   Ht 5\' 6"  (1.676 m)   Wt 61.3 kg   SpO2 97%   BMI 21.82 kg/m   General Appearance: No distress  Neuro:without focal findings,  speech normal,  HEENT: PERRLA, EOM intact.   Pulmonary: normal breath sounds, No wheezing.  Creased air entry in right apex. CardiovascularNormal S1,S2.  No m/r/g.   Abdomen: Benign, Soft, non-tender. Renal:  No costovertebral tenderness  GU:  No performed at this time. Endoc: No evident thyromegaly, no signs of acromegaly. Skin:   warm, no rashes, no ecchymosis  Extremities: normal, no cyanosis, clubbing.  Other findings:    LABORATORY PANEL:   CBC Recent Labs  Lab 03/16/18 0224  WBC 10.5  HGB 12.6*  HCT 37.6*  PLT 504*   ------------------------------------------------------------------------------------------------------------------  Chemistries  Recent Labs  Lab 03/16/18 0224  NA 138  K 3.8  CL 102  CO2 26  GLUCOSE 113*  BUN 8  CREATININE 0.65  CALCIUM 9.2   ------------------------------------------------------------------------------------------------------------------  Cardiac Enzymes Recent Labs  Lab 03/16/18 0935   TROPONINI 6.21*   ------------------------------------------------------------  RADIOLOGY:  Dg Chest 2 View  Result Date: 03/15/2018 CLINICAL DATA:  Initial evaluation for acute midsternal chest pain. EXAM: CHEST - 2 VIEW COMPARISON:  None. FINDINGS: Cardiac and mediastinal silhouettes are within normal limits. Lungs well inflated. There is an irregular nodular opacity overlying the right suprahilar region measuring approximately 3.7 cm in size. Cavitary change seen just distally within the right lung apex. Irregular architectural distortion present within this region. Irregular curvilinear scarring at the left infrahilar region lungs are otherwise clear. No other focal airspace disease. No pulmonary edema or definite pleural effusion. No pneumothorax. No acute osseous abnormality. IMPRESSION: 3.7 cm nodular density with adjacent cavitary change at the right lung apex, indeterminate. Further assessment with dedicated cross-sectional imaging recommended. Electronically Signed   By: Rise Mu M.D.   On: 03/15/2018 17:49   Ct Angio Chest Pe W And/or Wo Contrast  Result Date: 03/15/2018 CLINICAL DATA:  52 year old male with mid sternal and epigastric pain since 2 p.m. today. Abnormal chest x-ray. EXAM: CT ANGIOGRAPHY CHEST WITH CONTRAST TECHNIQUE: Multidetector CT imaging of the chest was performed using the standard protocol during bolus administration of intravenous contrast. Multiplanar CT image reconstructions and MIPs were  obtained to evaluate the vascular anatomy. CONTRAST:  75mL ISOVUE-370 IOPAMIDOL (ISOVUE-370) INJECTION 76% COMPARISON:  None. FINDINGS: Cardiovascular: No filling defects are noted in the pulmonary arterial tree to suggest underlying pulmonary embolism. Heart size is normal. There is no significant pericardial fluid, thickening or pericardial calcification. There is aortic atherosclerosis, as well as atherosclerosis of the great vessels of the mediastinum and the coronary  arteries, including calcified atherosclerotic plaque in the left anterior descending and left circumflex coronary arteries. Mediastinum/Nodes: No pathologically enlarged mediastinal or hilar lymph nodes. Right hilar lymph nodes measuring up to 9 mm in short axis (nonspecific). Esophagus is unremarkable in appearance. No axillary lymphadenopathy. Lungs/Pleura: In the posterior aspect of the right upper lobe there are multiple cavitary areas clustered in one region, which demonstrate thick walls, concerning for multifocal cavitary pneumonia. The possibility of cavitary mass is not excluded, but not strongly favored. Adjacent to these lesions there are some areas of peribronchovascular micro nodularity and ground-glass attenuation. Focal area of scarring in the superior segment of the left lower lobe. No pleural effusions. Upper Abdomen: Unremarkable. Musculoskeletal: There are no aggressive appearing lytic or blastic lesions noted in the visualized portions of the skeleton. Review of the MIP images confirms the above findings. IMPRESSION: 1. Findings are favored to reflect cavitary pneumonia in the right upper lobe, as above. Close attention after antimicrobial therapy is recommended to ensure complete resolution of these findings, as the possibility of a cavitary neoplasm is not excluded (but not favored at this time). 2. No evidence of pulmonary embolism. 3. Aortic atherosclerosis, in addition to 2 vessel coronary artery disease. Please note that although the presence of coronary artery calcium documents the presence of coronary artery disease, the severity of this disease and any potential stenosis cannot be assessed on this non-gated CT examination. Assessment for potential risk factor modification, dietary therapy or pharmacologic therapy may be warranted, if clinically indicated. Aortic Atherosclerosis (ICD10-I70.0). Electronically Signed   By: Trudie Reed M.D.   On: 03/15/2018 21:00       Thank  you  for the consultation and for allowing [facility] [place] [facility] to assist in the care of your patient. Our recommendations are noted above.  Please contact us if we can be of further service.   Wells Guiles, M.D., F.C.C.P.  Board Certified in Internal Medicine, Pulmonary Medicine, Critical Care Medicine, and Sleep Medicine.  [facility] Office Number: [phone]   03/16/2018

## 2018-03-16 NOTE — Consult Note (Signed)
NAME: Keith Warner  DOB: 09/08/1965  MRN: [id number]  Date/Time: 03/16/2018 1:12 PM  Keith Warner Subjective:  REASON FOR CONSULT: rt upper lobe cavity ? Keith Warner is a 52 y.o. male smoker, h/o heavy alcohol use is admitted with burning sensation in the chest which did not subside with tums and he vomited and then called EMS. He was given NG with relief of pain . In the ED CXR revealed a rt upper lobe cavity and CT chest confirmed it. He was diagnosed with NSTEMI and on heparin drip. 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 He wore a respirator while working. He developed cough, chills, fatigue and night sweats. He went to his PCP and was given steroids and inhalers which made him feel better for a few days. 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 He is a smoker ( 1 PPD) he used to drink heavily until a year ago when he quit completely. He does not specifically remember choking on any food  No recent travel, activities like swimming, sauna, white water rafting etc. No pets. No TB exposure  Past Medical History:  Diagnosis Date  . Asthma     Past Surgical History:  Procedure Laterality Date  . gangrene      SH Smoker Ex alcohol abuse Long time ago used marijuana and hard drugs  Family History  Problem Relation Age of Onset  . CAD Mother    No Known Allergies  ? Current Facility-Administered Medications  Medication Dose Route Frequency Provider Last Rate Last Dose  . acetaminophen (TYLENOL) tablet 650 mg  650 mg Oral Q6H PRN Keith Manis, MD       Or  . acetaminophen (TYLENOL) suppository 650 mg  650 mg Rectal Q6H PRN Keith Manis, MD      . albuterol (PROVENTIL) (2.5 MG/3ML) 0.083% nebulizer solution 2.5 mg  2.5 mg Inhalation Q6H PRN Keith Manis, MD      . aspirin EC tablet 81  mg  81 mg Oral Daily Keith Manis, MD   81 mg at 03/16/18 1130  . azithromycin (ZITHROMAX) 500 mg in sodium chloride 0.9 % 250 mL IVPB  500 mg Intravenous Q24H Keith Manis, MD   Stopped at 03/16/18 0026  . cefTRIAXone (ROCEPHIN) 2 g in sodium chloride 0.9 % 100 mL IVPB  2 g Intravenous Q24H Keith Manis, MD   Stopped at 03/15/18 2222  . heparin ADULT infusion 100 units/mL (25000 units/24mL sodium chloride 0.45%)  1,500 Units/hr Intravenous Continuous Pricilla Riffle, RPH 15 mL/hr at 03/16/18 1256 1,500 Units/hr at 03/16/18 1256  . [START ON 03/17/2018] Influenza vac split quadrivalent PF (FLUARIX) injection 0.5 mL  0.5 mL Intramuscular Tomorrow-1000 Keith Manis, MD      . nitroGLYCERIN (NITROSTAT) SL tablet 0.4 mg  0.4 mg Sublingual Q5 min PRN Keith Manis, MD   0.4 mg at 03/15/18 2208  . ondansetron (ZOFRAN) tablet 4 mg  4 mg Oral Q6H PRN Keith Manis, MD       Or  . ondansetron [facility]) injection 4 mg  4 mg Intravenous Q6H PRN Keith Manis, MD      . Melene Muller ON 03/17/2018] pneumococcal 23 valent vaccine (PNU-IMMUNE) injection 0.5 mL  0.5 mL Intramuscular Tomorrow-1000 Keith Manis, MD      .  potassium chloride (K-DUR,KLOR-CON) CR tablet 10 mEq  10 mEq Oral BID Michaelle Birks, Jacquelyn D, PA-C      . vancomycin (VANCOCIN) IVPB 1000 mg/200 mL premix  1,000 mg Intravenous Rigoberto Noel, MD 200 mL/hr at 03/16/18 0414 1,000 mg at 03/16/18 0414     Abtx:  Anti-infectives (From admission, onward)   Start     Dose/Rate Route Frequency Ordered Stop   03/16/18 2200  cefTRIAXone (ROCEPHIN) 2 g in sodium chloride 0.9 % 100 mL IVPB  Status:  Discontinued     2 g 200 mL/hr over 30 Minutes Intravenous Every 24 hours 03/15/18 2201 03/16/18 0308   03/16/18 0400  vancomycin (VANCOCIN) IVPB 1000 mg/200 mL premix     1,000 mg 200 mL/hr over 60 Minutes Intravenous Every 12 hours 03/15/18 2201     03/15/18 2115  vancomycin (VANCOCIN) IVPB 1000 mg/200 mL premix     1,000 mg 200 mL/hr over 60 Minutes  Intravenous  Once 03/15/18 2104 03/15/18 2315   03/15/18 2115  cefTRIAXone (ROCEPHIN) 2 g in sodium chloride 0.9 % 100 mL IVPB     2 g 200 mL/hr over 30 Minutes Intravenous Every 24 hours 03/15/18 2104     03/15/18 2115  azithromycin (ZITHROMAX) 500 mg in sodium chloride 0.9 % 250 mL IVPB     500 mg 250 mL/hr over 60 Minutes Intravenous Every 24 hours 03/15/18 2104        REVIEW OF SYSTEMS:  Const:  fever, chills, no significant  weight loss Eyes: negative diplopia or visual changes, negative eye pain ENT: negative coryza, negative sore throat Resp:  cough, No hemoptysis, dyspnea Cards:  chest pain, NO palpitations, lower extremity edema GU: negative for frequency, dysuria and hematuria GI: Negative for abdominal apin, diarrhea, bleeding, constipation Skin: negative for rash and pruritus Heme: negative for easy bruising and gum/nose bleeding MS: generalized fatigue and weakness Neurolo:negative for headaches, dizziness, vertigo, memory problems  Psych: negative for feelings of anxiety, depression  Endocrine: negative for thyroid, diabetes Allergy/Immunology- negative for any medication or food allergies ? Pertinent Positives include : Objective:  VITALS:  BP 107/82 (BP Location: Right Arm)   Pulse 86   Temp 98.5 F (36.9 C) (Oral)   Resp 19   Ht 5\' 6"  (1.676 m)   Wt 61.3 kg   SpO2 97%   BMI 21.82 kg/m  PHYSICAL EXAM:  General: Alert, cooperative, no distress, appears stated age.  Head: Normocephalic, without obvious abnormality, atraumatic. Eyes: Conjunctivae clear, anicteric sclerae. Pupils are equal ENT Nares normal. No drainage or sinus tenderness. Lips, mucosa, and tongue normal. No Thrush Neck: Supple, symmetrical, no adenopathy, thyroid: non tender no carotid bruit and no JVD. Back: No CVA tenderness. Lungs: Clear to auscultation bilaterally. No Wheezing or Rhonchi. No rales. Heart: Regular rate and rhythm, no murmur, rub or gallop. Abdomen: Soft,  non-tender,not distended. Bowel sounds normal. No masses Extremities: atraumatic, no cyanosis. No edema. No clubbing Skin: No rashes or lesions. Or bruising Lymph: Cervical, supraclavicular normal. Neurologic: Grossly non-focal Pertinent Labs Lab Results CBC    Component Value Date/Time   WBC 10.5 03/16/2018 0224   RBC 4.05 (L) 03/16/2018 0224   HGB 12.6 (L) 03/16/2018 0224   HCT 37.6 (L) 03/16/2018 0224   PLT 504 (H) 03/16/2018 0224   MCV 92.8 03/16/2018 0224   MCH 31.1 03/16/2018 0224   MCHC 33.5 03/16/2018 0224   RDW 13.2 03/16/2018 0224    CMP Latest Ref Rng & Units 03/16/2018 03/15/2018  Glucose 70 - 99 mg/dL 299(B) 716(R)  BUN 6 - 20 mg/dL 8 10  Creatinine 6.78 - 1.24 mg/dL 9.38 1.01  Sodium 751 - 145 mmol/L 138 137  Potassium 3.5 - 5.1 mmol/L 3.8 3.5  Chloride 98 - 111 mmol/L 102 99  CO2 22 - 32 mmol/L 26 28  Calcium 8.9 - 10.3 mg/dL 9.2 02.5      Microbiology: Recent Results (from the past 240 hour(s))  Blood Culture (routine x 2)     Status: None (Preliminary result)   Collection Time: 03/15/18  9:28 PM  Result Value Ref Range Status   Specimen Description BLOOD LEFT ANTECUBITAL  Final   Special Requests   Final    BOTTLES DRAWN AEROBIC AND ANAEROBIC Blood Culture results may not be optimal due to an excessive volume of blood received in culture bottles   Culture   Final    NO GROWTH < 12 HOURS Performed at [facility], [address], [place], [place] [address]    Report Status PENDING  Incomplete  Blood Culture (routine x 2)     Status: None (Preliminary result)   Collection Time: 03/16/18  2:52 AM  Result Value Ref Range Status   Specimen Description BLOOD BLOOD LEFT HAND  Final   Special Requests   Final    BOTTLES DRAWN AEROBIC AND ANAEROBIC Blood Culture adequate volume   Culture   Final    NO GROWTH < 12 HOURS Performed at [facility], [address] [place], [place] [address]    Report Status PENDING  Incomplete  MRSA PCR  Screening     Status: None   Collection Time: 03/16/18  3:10 AM  Result Value Ref Range Status   MRSA by PCR NEGATIVE NEGATIVE Final    Comment:        The GeneXpert MRSA Assay (FDA approved for NASAL specimens only), is one component of a comprehensive MRSA colonization surveillance program. It is not intended to diagnose MRSA infection nor to guide or monitor treatment for MRSA infections. Performed at [facility], [address] [place], [place] [address]    IMAGING RESULTS:   ?   2d echo  Left ventricle:  The cavity size was severely dilated. Systolic function was moderately to severely reduced. The estimated ejection fraction was 35%. Diffuse hypokinesis  Impression/Recommendation 52 y.o. male smoker, h/o heavy alcohol use is admitted with burning sensation in the chest which did not subside with tums and he vomited and then called EMS. He was given NG with relief of pain . In the ED CXR revealed a rt upper lobe cavity and CT chest confirmed it. He was diagnosed with NSTEMI and on heparin drip. He is also on airborne isolation and I am asked to see him for cavitary lesion? ? ?Rt upper lobe cavitary lesion- likely necrotizing cavitary pneumonia, r/o aspiration. Coincidental black mold exposure but doubt fungal pneumonia Less likely TB Will still get 3 sputums for AFB ( every 8hours ) Currently on ceftriaxone and zithromax and vanco- MRSa nares neg so les slikely this could be a MRSA pneumonia- DC vanco- add flagyl   NSTEMI- workup and management should proceed as needed and being on airborne is not a contraindication or should be a deterrent.  Infection prevention Nurse can help with precautions during cardiac cath and stenting  Smoker  ___________________________________________________ Discussed with patient and hospitalist

## 2018-03-16 NOTE — Progress Notes (Signed)
[employer] -  at [facility] [place]      PATIENT NAME: Keith Warner    MR#:  [id number]  DATE OF BIRTH:  09/09/1965  SUBJECTIVE:   Patient presented to the hospital with chest pain and noted to have a right upper lobe cavitary pneumonia, and also noted to have an elevated troponin.  Presently patient denies any worsening chest pain or shortness of breath.  He is admits to a cough which is nonproductive.  He denies any hemoptysis.  REVIEW OF SYSTEMS:    Review of Systems  Constitutional: Negative for chills and fever.  HENT: Negative for congestion and tinnitus.   Eyes: Negative for blurred vision and double vision.  Respiratory: Positive for cough. Negative for shortness of breath and wheezing.   Cardiovascular: Negative for chest pain, orthopnea and PND.  Gastrointestinal: Negative for abdominal pain, diarrhea, nausea and vomiting.  Genitourinary: Negative for dysuria and hematuria.  Neurological: Negative for dizziness, sensory change and focal weakness.  All other systems reviewed and are negative.   Nutrition: Heart healthy Tolerating Diet: Yes Tolerating PT:  Ambulatory   DRUG ALLERGIES:  No Known Allergies  VITALS:  Blood pressure 106/84, pulse 97, temperature 98.1 F (36.7 C), temperature source Oral, resp. rate 19, height 5\' 6"  (1.676 m), weight 61.3 kg, SpO2 99 %.  PHYSICAL EXAMINATION:   Physical Exam  GENERAL:  52 y.o.-year-old patient lying in bed in no acute distress.  EYES: Pupils equal, round, reactive to light and accommodation. No scleral icterus. Extraocular muscles intact.  HEENT: Head atraumatic, normocephalic. Oropharynx and nasopharynx clear.  NECK:  Supple, no jugular venous distention. No thyroid enlargement, no tenderness.  LUNGS: Normal breath sounds bilaterally, no wheezing, rales, rhonchi. No use of accessory muscles of respiration.  CARDIOVASCULAR: S1, S2 normal. No murmurs, rubs, or gallops.  ABDOMEN: Soft,  nontender, nondistended. Bowel sounds present. No organomegaly or mass.  EXTREMITIES: No cyanosis, clubbing or edema b/l.    NEUROLOGIC: Cranial nerves II through XII are intact. No focal Motor or sensory deficits b/l.   PSYCHIATRIC: The patient is alert and oriented x 3.  SKIN: No obvious rash, lesion, or ulcer.    LABORATORY PANEL:   CBC Recent Labs  Lab 03/16/18 0224  WBC 10.5  HGB 12.6*  HCT 37.6*  PLT 504*   ------------------------------------------------------------------------------------------------------------------  Chemistries  Recent Labs  Lab 03/16/18 0224 03/16/18 1433  NA 138  --   K 3.8  --   CL 102  --   CO2 26  --   GLUCOSE 113*  --   BUN 8  --   CREATININE 0.65  --   CALCIUM 9.2  --   MG  --  1.9  AST  --  36  ALT  --  11  ALKPHOS  --  54  BILITOT  --  1.0   ------------------------------------------------------------------------------------------------------------------  Cardiac Enzymes Recent Labs  Lab 03/16/18 1433  TROPONINI 4.61*   ------------------------------------------------------------------------------------------------------------------  RADIOLOGY:  Dg Chest 2 View  Result Date: 03/15/2018 CLINICAL DATA:  Initial evaluation for acute midsternal chest pain. EXAM: CHEST - 2 VIEW COMPARISON:  None. FINDINGS: Cardiac and mediastinal silhouettes are within normal limits. Lungs well inflated. There is an irregular nodular opacity overlying the right suprahilar region measuring approximately 3.7 cm in size. Cavitary change seen just distally within the right lung apex. Irregular architectural distortion present within this region. Irregular curvilinear scarring at the left infrahilar region lungs are otherwise clear. No other focal airspace disease. No pulmonary  edema or definite pleural effusion. No pneumothorax. No acute osseous abnormality. IMPRESSION: 3.7 cm nodular density with adjacent cavitary change at the right lung apex,  indeterminate. Further assessment with dedicated cross-sectional imaging recommended. Electronically Signed   By: Rise Mu M.D.   On: 03/15/2018 17:49   Ct Angio Chest Pe W And/or Wo Contrast  Result Date: 03/15/2018 CLINICAL DATA:  52 year old male with mid sternal and epigastric pain since 2 p.m. today. Abnormal chest x-ray. EXAM: CT ANGIOGRAPHY CHEST WITH CONTRAST TECHNIQUE: Multidetector CT imaging of the chest was performed using the standard protocol during bolus administration of intravenous contrast. Multiplanar CT image reconstructions and MIPs were obtained to evaluate the vascular anatomy. CONTRAST:  75mL ISOVUE-370 IOPAMIDOL (ISOVUE-370) INJECTION 76% COMPARISON:  None. FINDINGS: Cardiovascular: No filling defects are noted in the pulmonary arterial tree to suggest underlying pulmonary embolism. Heart size is normal. There is no significant pericardial fluid, thickening or pericardial calcification. There is aortic atherosclerosis, as well as atherosclerosis of the great vessels of the mediastinum and the coronary arteries, including calcified atherosclerotic plaque in the left anterior descending and left circumflex coronary arteries. Mediastinum/Nodes: No pathologically enlarged mediastinal or hilar lymph nodes. Right hilar lymph nodes measuring up to 9 mm in short axis (nonspecific). Esophagus is unremarkable in appearance. No axillary lymphadenopathy. Lungs/Pleura: In the posterior aspect of the right upper lobe there are multiple cavitary areas clustered in one region, which demonstrate thick walls, concerning for multifocal cavitary pneumonia. The possibility of cavitary mass is not excluded, but not strongly favored. Adjacent to these lesions there are some areas of peribronchovascular micro nodularity and ground-glass attenuation. Focal area of scarring in the superior segment of the left lower lobe. No pleural effusions. Upper Abdomen: Unremarkable. Musculoskeletal: There are no  aggressive appearing lytic or blastic lesions noted in the visualized portions of the skeleton. Review of the MIP images confirms the above findings. IMPRESSION: 1. Findings are favored to reflect cavitary pneumonia in the right upper lobe, as above. Close attention after antimicrobial therapy is recommended to ensure complete resolution of these findings, as the possibility of a cavitary neoplasm is not excluded (but not favored at this time). 2. No evidence of pulmonary embolism. 3. Aortic atherosclerosis, in addition to 2 vessel coronary artery disease. Please note that although the presence of coronary artery calcium documents the presence of coronary artery disease, the severity of this disease and any potential stenosis cannot be assessed on this non-gated CT examination. Assessment for potential risk factor modification, dietary therapy or pharmacologic therapy may be warranted, if clinically indicated. Aortic Atherosclerosis (ICD10-I70.0). Electronically Signed   By: Trudie Reed M.D.   On: 03/15/2018 21:00     ASSESSMENT AND PLAN:   52 year old male with past medical history of asthma, ongoing tobacco abuse who presents to the hospital due to chest pain.  1.  Right upper lobe cavitary pneumonia- patient presented to the hospital with chest pain underwent CT of the chest which was suggestive of a right upper lobe cavitary pneumonia.  Patient has no previous history of tuberculosis. -Continue airborne precautions for now await QuantiFERON testing, AFB smears have been ordered. -Await infectious disease input, pulmonary consulted, continue IV antibiotics with vancomycin, ceftriaxone, Zithromax.  2.  Non-ST elevation MI-patient has ruled in by cardiac markers.  He presented with chest pain and his troponins have trended upwards. -Seen by cardiology, echocardiogram done which showed apical, anterior wall hypokinesis. -Continue heparin drip, aspirin for now. -Patient will likely need ischemic  evaluation once tuberculosis  has been ruled out.   All the records are reviewed and case discussed with Care Management/Social Worker. Management plans discussed with the patient, family and they are in agreement.  CODE STATUS: Full code  DVT Prophylaxis: Heparin gtt  TOTAL TIME TAKING CARE OF THIS PATIENT: 35 minutes.   POSSIBLE D/C IN 2-3 DAYS, DEPENDING ON CLINICAL CONDITION.   Houston Siren M.D on 03/16/2018 at 5:00 PM  Between 7am to 6pm - Pager - [phone]  After 6pm go to [email or web address] - Scientist, research (life sciences) [facility]  Office  [phone]  CC: Primary care physician; [name], NP

## 2018-03-16 NOTE — Progress Notes (Signed)
Initial Nutrition Assessment  DOCUMENTATION CODES:   Not applicable  INTERVENTION: Current NPO; Monitor for diet advancement Recommend Ensure Enlive po BID, each supplement provides 350 kcal and 20 grams of protein Recommend MVI   NUTRITION DIAGNOSIS:   Inadequate oral intake related to acute illness as evidenced by NPO status(for procedure).  GOAL:   Patient will meet greater than or equal to 90% of their needs  MONITOR:   Diet advancement  REASON FOR ASSESSMENT:   Malnutrition Screening Tool    ASSESSMENT:  52yr old pt presented to ED yesterday experiencing 10/10 chest pain. Pt attempted to relieve the "burning" sensation by using Tums and reported vomiting. Pt reports coughing w/ white/yellow sputum, body aches, night sweats, and fatigue x 64mo. CT confirms presence of new rt upper lobe lesion. Current smoker w/ PMH of asthma.  Pt reporting extreme thirst at time of visit w/ NPO diet order and stated that he was thinking of going into the bathroom and sticking his head under the sink if his diet order was not advanced soon and began laughing. Pt requested a gallon of sweet tea and a pot of coffee once his diet was advanced. PTA, pt reports having a good appetite and usually 15meals/day.   Breakfast consisting of bowl of cereal or pop tart and 2/3 cups of coffee. Lunch is usually a sandwich ([place], bologna, ham) w/ chips Pt reports always cooking a big dinner for him and his 12yr old son. Pt stated he recently got an air fryer and has enjoyed cooking chicken breast in it. Pt also reports regular use of a crockpot for meal prep and liking green beans, turnips, squash, and corn.   Pt recalls weighing 170lbs 23yr ago and stating he was fat and unhealthy. 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 Pt reports UBW of 150lbs 1 mo ago and stated current wt of 132 being too skinny.   Pt reports having Boost in the past and finding it to have chalky taste,  pt does not care for them but agreeable to having them while in the hospital. RD told pt that he could try pouring them over ice to diminish chalky aftertaste.   Medications: vancomycin Labs: Troponin 1: 6.21 (H)  11/6: ECHO  NUTRITION - FOCUSED PHYSICAL EXAM:    Most Recent Value  Orbital Region  Mild depletion  Upper Arm Region  No depletion  Thoracic and Lumbar Region  No depletion  Buccal Region  Mild depletion  Temple Region  Mild depletion  Clavicle Bone Region  No depletion  Clavicle and Acromion Bone Region  No depletion  Scapular Bone Region  No depletion  Dorsal Hand  No depletion  Patellar Region  Mild depletion  Anterior Thigh Region  Mild depletion  Posterior Calf Region  Mild depletion  Edema (RD Assessment)  None  Hair  Reviewed  Eyes  Reviewed  Mouth  Reviewed  Skin  Reviewed  Nails  Reviewed       Diet Order:   Diet Order            Diet Heart Room service appropriate? Yes; Fluid consistency: Thin  Diet effective now              EDUCATION NEEDS:   No education needs have been identified at this time  Skin:  Skin Assessment: Reviewed RN Assessment  Last BM:  11/4 per pt report  Height:   Ht Readings from Last 1 Encounters:  03/16/18 5\' 6"  (1.676 m)    Weight:   Wt Readings from Last 1 Encounters:  03/16/18 61.3 kg    Ideal Body Weight:  63.8 kg  BMI:  Body mass index is 21.82 kg/m.  Estimated Nutritional Needs:   Kcal:  1775-1900  Protein:  74-95g  Fluid:  1.7-1.9L    Lars Masson, RD, LDN  After Hours/Weekend Pager: [phone]

## 2018-03-16 NOTE — Progress Notes (Signed)
ANTICOAGULATION CONSULT NOTE - Initial Consult  Pharmacy Consult for Heparin  Indication: chest pain/ACS  No Known Allergies  Patient Measurements: Height: 5\' 6"  (167.6 cm) Weight: 135 lb 3.2 oz (61.3 kg) IBW/kg (Calculated) : 63.8 Heparin Dosing Weight:  61.3 kg   Vital Signs: Temp: 98.5 F (36.9 C) (11/06 0730) Temp Source: Oral (11/06 0730) BP: 107/82 (11/06 0730) Pulse Rate: 86 (11/06 0730)  Labs: Recent Labs    03/15/18 1705 03/15/18 2139 03/16/18 0224 03/16/18 0315 03/16/18 0935  HGB 14.3  --  12.6*  --   --   HCT 42.8  --  37.6*  --   --   PLT 545*  --  504*  --   --   APTT  --  34  --   --   --   LABPROT  --  14.6  --   --   --   INR  --  1.15  --   --   --   HEPARINUNFRC  --   --   --  0.16* 0.12*  CREATININE 0.85  --  0.65  --   --   TROPONINI 0.19*  --  5.66*  --  6.21*    Estimated Creatinine Clearance: 95.8 mL/min (by C-G formula based on SCr of 0.65 mg/dL).   Medical History: Past Medical History:  Diagnosis Date  . Asthma      Assessment: 52 year old male presented with chest pain. Troponin increase to 6.21. Patient does not take any anticoagulant PTA.  Goal of Therapy:  Heparin level 0.3-0.7 units/ml Monitor platelets by anticoagulation protocol: Yes   Plan:  11/6 0935 HL 0.12. Subtherapeutic. Will give heparin 1800 unit bolus and increase heparin drip to 1500 units/hr. HL ordered for 1700. CBC with morning labs.  Pricilla Riffle, PharmD Pharmacy Resident  03/16/2018 11:00 AM

## 2018-03-16 NOTE — Consult Note (Signed)
Cardiology Consultation:   Patient ID: [name] MRN: [id number]; DOB: [birth year]  Admit date: 03/15/2018 Date of Consult: 03/16/2018  Primary Care Provider: [name], NP Primary Cardiologist: Tona Sensing - Dr. Mariah Milling Primary Electrophysiologist:  None    Patient Profile:   Keith Warner is a 52 y.o. male with a hx of HLD, smoker, asthma who is being seen today for the evaluation of NSTEMI at the request of Dr. Anne Hahn.  History of Present Illness:   Keith Warner is a 52 yo with no previous cardiac history and PMH as above.  Over the last three weeks, he has reportedly been working "under a house" where there was a lot of "black mold." He reported that, soon after he started working under the house, he started coughing. He reportedly has been coughing and sick for the last three weeks. He stated that his cough is productive and described non-bloody, white sputum. He did report night sweats but attributed them to his "big heavy blanket." He denied any associated sx of pre-syncope. No reported SOB, racing heart rate, palpitations. He did note increased fatigue over the last 3 weeks. He stated that yesterday 03/15/18 and around 2PM he felt burning in his throat that only occurred with coughing. He stated that this burning started when he was using bleach under the house. He stated that he wore a respirator mask; however, he feels that "maybe the mold got through and using the heavy chemical to eliminate the bleach may have aggravated everything."  He reportedly took TUMS to see if this alleviated the burning throat pain; however, the tums did not alleviate the pain, as he reportedly ended up throwing them up almost immediately. He reported to [facility] ED for further evaluation, "just to be sure it wasn't something serious, given the mold." He stated that, upon his arrival to the ED, he received SL nitro that "made my throat pain much worse and gave me a horrible headache."  Of note,  patient has reportedly recently completed a prednisone taper and has been taking abx and mucinex at home for his cough without alleviation of the cough.   03/15/18  In the ED Vitals: BP 133/94, HR 91, RR 16, 98% ORA Labs: WBC 13.6, Hgb 14.3, plts 545, Na 137, K 3.5, glucose 143, Cr 0.85, BUN 10, Ca 10.0 Troponin 0.19 (and later troponin up to 6 as below) EKG: NSR, previous MI, non-specific ST/T wave abnormality  CXR: 3.7cm nodular density with CT confirming RUL cavitary lesion. Also showed aortic atherosclerosis with 2v CAD.  Meds: Heparin gtt, abx. TB Quantiferon and AFB labs pending  Pending echo though preliminary review by Dr. Mariah Milling shows anterior wall hypokinesis and moderately depressed EF of 35%.  On exam today, patient reports no further CP since 10PM last night 11/5.   Past Medical History:  Diagnosis Date  . Asthma     Past Surgical History:  Procedure Laterality Date  . gangrene       Home Medications:  Prior to Admission medications   Medication Sig Start Date End Date Taking? Authorizing Provider  albuterol (PROVENTIL HFA;VENTOLIN HFA) 108 (90 Base) MCG/ACT inhaler Inhale 1 puff into the lungs every 6 (six) hours as needed for wheezing. 02/27/18  Yes [provider]  bacitracin ointment Apply 1 application topically 2 (two) times daily. Patient not taking: Reported on 03/15/2018 03/01/16   Francoise Ceo, DO    Inpatient Medications: Scheduled Meds: . aspirin EC  81 mg Oral Daily  . [  START ON 03/17/2018] Influenza vac split quadrivalent PF  0.5 mL Intramuscular Tomorrow-1000  . [START ON 03/17/2018] pneumococcal 23 valent vaccine  0.5 mL Intramuscular Tomorrow-1000  . potassium chloride  10 mEq Oral BID   Continuous Infusions: . azithromycin Stopped (03/16/18 0026)  . cefTRIAXone (ROCEPHIN)  IV Stopped (03/15/18 2222)  . heparin 1,500 Units/hr (03/16/18 1256)  . vancomycin 1,000 mg (03/16/18 0414)   PRN Meds: acetaminophen **OR** acetaminophen,  albuterol, nitroGLYCERIN, ondansetron **OR** ondansetron (ZOFRAN) IV  Allergies:   No Known Allergies  Social History:   Social History   Socioeconomic History  . Marital status: Single    Spouse name: Not on file  . Number of children: Not on file  . Years of education: Not on file  . Highest education level: Not on file  Occupational History  . Not on file  Social Needs  . Financial resource strain: Not on file  . Food insecurity:    Worry: Not on file    Inability: Not on file  . Transportation needs:    Medical: Not on file    Non-medical: Not on file  Tobacco Use  . Smoking status: Current Every Day Smoker    Packs/day: 1.00    Years: 35.00    Pack years: 35.00    Types: Cigarettes  . Smokeless tobacco: Never Used  Substance and Sexual Activity  . Alcohol use: Yes  . Drug use: Not on file  . Sexual activity: Not on file  Lifestyle  . Physical activity:    Days per week: Not on file    Minutes per session: Not on file  . Stress: Not on file  Relationships  . Social connections:    Talks on phone: Not on file    Gets together: Not on file    Attends religious service: Not on file    Active member of club or organization: Not on file    Attends meetings of clubs or organizations: Not on file    Relationship status: Not on file  . Intimate partner violence:    Fear of current or ex partner: Not on file    Emotionally abused: Not on file    Physically abused: Not on file    Forced sexual activity: Not on file  Other Topics Concern  . Not on file  Social History Narrative  . Not on file    Family History:    Family History  Problem Relation Age of Onset  . CAD Mother      ROS:  Please see the history of present illness.   All other ROS reviewed and negative.     Physical Exam/Data:   Vitals:   03/16/18 0114 03/16/18 0212 03/16/18 0640 03/16/18 0730  BP: 103/76 (!) 120/93 106/85 107/82  Pulse: 85 88 91 86  Resp: 18 18 20 19   Temp:  98 F (36.7  C) 98 F (36.7 C) 98.5 F (36.9 C)  TempSrc:  Oral Oral Oral  SpO2: 98% 97% 98% 97%  Weight:  61.3 kg    Height:  5\' 6"  (1.676 m)      Intake/Output Summary (Last 24 hours) at 03/16/2018 1434 Last data filed at 03/16/2018 0640 Gross per 24 hour  Intake 1050 ml  Output 0 ml  Net 1050 ml   Filed Weights   03/15/18 1703 03/16/18 0212  Weight: 65.8 kg 61.3 kg   Body mass index is 21.82 kg/m.  General:  Well nourished, well developed, in no acute  distress. Under very heavy quilt  HEENT: normal Neck: no JVD Vascular: No carotid bruits Cardiac:  normal S1, S2; RRR; no murmur  Lungs:  Bilateral crackles, worse on R side Abd: soft, nontender, no hepatomegaly  Ext: no edema Musculoskeletal:  No deformities Skin: warm and dry  Neuro:  no focal abnormalities noted Psych:  Normal affect   EKG:  The EKG was personally reviewed and demonstrates:  As in HPI Telemetry:  Telemetry was personally reviewed and demonstrates:  NSR  Relevant CV Studies: Pending echo  Laboratory Data:  Chemistry Recent Labs  Lab 03/15/18 1705 03/16/18 0224  NA 137 138  K 3.5 3.8  CL 99 102  CO2 28 26  GLUCOSE 143* 113*  BUN 10 8  CREATININE 0.85 0.65  CALCIUM 10.0 9.2  GFRNONAA >60 >60  GFRAA >60 >60  ANIONGAP 10 10    No results for input(s): PROT, ALBUMIN, AST, ALT, ALKPHOS, BILITOT in the last 168 hours. Hematology Recent Labs  Lab 03/15/18 1705 03/16/18 0224  WBC 13.6* 10.5  RBC 4.59 4.05*  HGB 14.3 12.6*  HCT 42.8 37.6*  MCV 93.2 92.8  MCH 31.2 31.1  MCHC 33.4 33.5  RDW 13.2 13.2  PLT 545* 504*   Cardiac Enzymes Recent Labs  Lab 03/15/18 1705 03/16/18 0224 03/16/18 0935  TROPONINI 0.19* 5.66* 6.21*   No results for input(s): TROPIPOC in the last 168 hours.  BNPNo results for input(s): BNP, PROBNP in the last 168 hours.  DDimer No results for input(s): DDIMER in the last 168 hours.  Radiology/Studies:  Dg Chest 2 View  Result Date: 03/15/2018 CLINICAL DATA:   Initial evaluation for acute midsternal chest pain. EXAM: CHEST - 2 VIEW COMPARISON:  None. FINDINGS: Cardiac and mediastinal silhouettes are within normal limits. Lungs well inflated. There is an irregular nodular opacity overlying the right suprahilar region measuring approximately 3.7 cm in size. Cavitary change seen just distally within the right lung apex. Irregular architectural distortion present within this region. Irregular curvilinear scarring at the left infrahilar region lungs are otherwise clear. No other focal airspace disease. No pulmonary edema or definite pleural effusion. No pneumothorax. No acute osseous abnormality. IMPRESSION: 3.7 cm nodular density with adjacent cavitary change at the right lung apex, indeterminate. Further assessment with dedicated cross-sectional imaging recommended. Electronically Signed   By: Rise Mu M.D.   On: 03/15/2018 17:49   Ct Angio Chest Pe W And/or Wo Contrast  Result Date: 03/15/2018 CLINICAL DATA:  52 year old male with mid sternal and epigastric pain since 2 p.m. today. Abnormal chest x-ray. EXAM: CT ANGIOGRAPHY CHEST WITH CONTRAST TECHNIQUE: Multidetector CT imaging of the chest was performed using the standard protocol during bolus administration of intravenous contrast. Multiplanar CT image reconstructions and MIPs were obtained to evaluate the vascular anatomy. CONTRAST:  75mL ISOVUE-370 IOPAMIDOL (ISOVUE-370) INJECTION 76% COMPARISON:  None. FINDINGS: Cardiovascular: No filling defects are noted in the pulmonary arterial tree to suggest underlying pulmonary embolism. Heart size is normal. There is no significant pericardial fluid, thickening or pericardial calcification. There is aortic atherosclerosis, as well as atherosclerosis of the great vessels of the mediastinum and the coronary arteries, including calcified atherosclerotic plaque in the left anterior descending and left circumflex coronary arteries. Mediastinum/Nodes: No  pathologically enlarged mediastinal or hilar lymph nodes. Right hilar lymph nodes measuring up to 9 mm in short axis (nonspecific). Esophagus is unremarkable in appearance. No axillary lymphadenopathy. Lungs/Pleura: In the posterior aspect of the right upper lobe there are multiple cavitary areas clustered  in one region, which demonstrate thick walls, concerning for multifocal cavitary pneumonia. The possibility of cavitary mass is not excluded, but not strongly favored. Adjacent to these lesions there are some areas of peribronchovascular micro nodularity and ground-glass attenuation. Focal area of scarring in the superior segment of the left lower lobe. No pleural effusions. Upper Abdomen: Unremarkable. Musculoskeletal: There are no aggressive appearing lytic or blastic lesions noted in the visualized portions of the skeleton. Review of the MIP images confirms the above findings. IMPRESSION: 1. Findings are favored to reflect cavitary pneumonia in the right upper lobe, as above. Close attention after antimicrobial therapy is recommended to ensure complete resolution of these findings, as the possibility of a cavitary neoplasm is not excluded (but not favored at this time). 2. No evidence of pulmonary embolism. 3. Aortic atherosclerosis, in addition to 2 vessel coronary artery disease. Please note that although the presence of coronary artery calcium documents the presence of coronary artery disease, the severity of this disease and any potential stenosis cannot be assessed on this non-gated CT examination. Assessment for potential risk factor modification, dietary therapy or pharmacologic therapy may be warranted, if clinically indicated. Aortic Atherosclerosis (ICD10-I70.0). Electronically Signed   By: Trudie Reed M.D.   On: 03/15/2018 21:00    Assessment and Plan:   1. NSTEMI - No current CP. No previous cardiac history. - Pending official echo report with preliminary read showing anterior wall  hypokinesis, moderately depressed EF ~35% - Troponin increased and peaked at 6.21, currently down trending 0.19  5.66  6.21  4.61 - EKG with nonspecific ST/T wave abnormalities - Risk factors for cardiac etiology include family history, smoker - Started on heparin. Ordered daily CBC  - Ordered KCl tab to replete potassium. Mg checked and 1.9. Follow-up BMET ordered for AM.  - Checking A1C - Cr 0.85  0.65, Hgb 12.6 - Continue medical management with ASA 81mg . Not on BB d/t reduced EF and hypotension at this time and pending official review of echo.   - Lipid panel ordered - LDL 106.  - Will need started on high intensity statin before discharge as liver function checked as well and normal.  - Will need cardiac catheterization pending ID, pulmonology consults for cavitary lesion. If he requires bx or bronchoscopy, will plan cath for after those procedures as PCI requires uninterrupted DAPT. TB status also unknown with AFB and quantiferon tests pending. Diet started today since will not undergo cath today. Will need to be made NPO overnight if decision to cath and with consideration of dropping hemoglobin as below.   2. Hypokalemia - Replete with goal 4.0 as above  3. HLD - LDL not at goal - Recommend statin therapy as above  4. Cavitary lesion - Pulmonology, ID consulted - Multiple cultures pending, TB tests pending results - Per IM  5. Anemia, mild - Drop overnight from 14.3  12.6. Platelets 504 - Continue to monitor as on heparin and cavitary lesion at risk of bleeding. Will need to discontinue heparin if hemoglobin continues to drop.  - Continue to monitor closely - Per IM  6. Hyperglycemia - Checking A1C  7. Tobacco use - Smoking cessation counseling     For questions or updates, please contact [facility] Please consult [email or web address] for contact info under     Signed, Lennon Alstrom, PA-C  03/16/2018 2:34 PM

## 2018-03-16 NOTE — Progress Notes (Addendum)
ANTICOAGULATION CONSULT NOTE - Initial Consult  Pharmacy Consult for Heparin  Indication: chest pain/ACS  No Known Allergies  Patient Measurements: Height: 5\' 6"  (167.6 cm) Weight: 135 lb 3.2 oz (61.3 kg) IBW/kg (Calculated) : 63.8 Heparin Dosing Weight:  65.8 kg   Vital Signs: Temp: 98 F (36.7 C) (11/06 0212) Temp Source: Oral (11/06 0212) BP: 120/93 (11/06 0212) Pulse Rate: 88 (11/06 0212)  Labs: Recent Labs    03/15/18 1705 03/15/18 2139 03/16/18 0224 03/16/18 0315  HGB 14.3  --  12.6*  --   HCT 42.8  --  37.6*  --   PLT 545*  --  504*  --   APTT  --  34  --   --   LABPROT  --  14.6  --   --   INR  --  1.15  --   --   HEPARINUNFRC  --   --   --  0.16*  CREATININE 0.85  --  0.65  --   TROPONINI 0.19*  --  5.66*  --     Estimated Creatinine Clearance: 95.8 mL/min (by C-G formula based on SCr of 0.65 mg/dL).   Medical History: Past Medical History:  Diagnosis Date  . Asthma     Medications:  Medications Prior to Admission  Medication Sig Dispense Refill Last Dose  . albuterol (PROVENTIL HFA;VENTOLIN HFA) 108 (90 Base) MCG/ACT inhaler Inhale 1 puff into the lungs every 6 (six) hours as needed for wheezing.  0 prn at prn  . bacitracin ointment Apply 1 application topically 2 (two) times daily. (Patient not taking: Reported on 03/15/2018) 120 g 0 Completed Course at Unknown time    Assessment: Pharmacy consulted to dose heparin in this 52 year old male with ACS/NSTEMI.   CrCl = 93.8 ml/min No prior anticoag noted.  Goal of Therapy:  Heparin level 0.3-0.7 units/ml Monitor platelets by anticoagulation protocol: Yes   Plan:  11/06 @ 0300 HL 0.16 subtherapeutic. Will rebolus w/ heparin 2000 units IV x 1 and increase rate to 1350 units/hr and will recheck HL @ 1000, Hgb 14.3 >> 12.6 will continue to monitor.  Thomasene Ripple, PharmD, BCPS Clinical Pharmacist 03/16/2018

## 2018-03-16 NOTE — Progress Notes (Signed)
Attending Note Patient seen and examined, agree with detailed note above,  Patient presentation and plan discussed on rounds.  Cardiology consult placed by Dr.  Anne Hahn for non-STEMI  EKG lab work, chest x-ray, echocardiogram reviewed independently by myself  52 year old gentleman with long tree of smoking, hyperlipidemia, asthma, presenting to the hospital with acute onset chest pain, burning in his upper chest, coughing over the past 3 to 4 weeks. 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 Previously treated with prednisone may have improved his symptoms, overall not going away  In the emergency room chest x-ray with right upper lobe cavity Chest CT scan showing new right upper lobe cavitary lesion Initial cardiac enzymes troponin 0 0.19 with trend up to 6  Currently without chest pain, reports breathing stable, continues to have cough  Preliminary echocardiogram reviewed by myself showing anterior wall hypokinesis, moderately depressed ejection fraction 35%  On exam no JVD, lungs with Rales worse on the right, heart sounds regular normal S1-S2 no murmurs appreciated abdomen soft nontender no significant lower extremity edema, communicative, alert, oriented, grossly normal musculoskeletal and neurologic exam  Review of Systems  Constitutional: Positive for malaise/fatigue, chills, fever HENT: Negative for ear pain, hearing loss and tinnitus.   Eyes: Negative for blurred vision, double vision, pain and redness.  Respiratory: +cough, shortness of breath.   Cardiovascular: + chest pain, no palpitations, orthopnea and leg swelling.  Gastrointestinal:  Negative for abdominal  pain, constipation and diarrhea.  Genitourinary: Negative for dysuria, frequency and hematuria.  Musculoskeletal: Negative for back pain, joint pain and neck pain.  Skin: warm, dry Neurological: Negative for dizziness, tremors and focal weakness.  Endo/Heme/Allergies: Negative for polydipsia. Does not bruise/bleed easily.  Psychiatric/Behavioral: Negative for depression. T  Lab work reviewed showing sodium 138 potassium 3.8 creatinine 0.65 total cholesterol 160 hemoglobin 12.6 troponin 6.21  EKG reviewed personally by myself showing normal sinus rhythm old anterior MI nonspecific ST-T wave abnormality  A/P: Cavitary lesion Pulmonary consulted, Ongoing over the past month or so, has been in the basement with possible black mold Long history of smoking On broad-spectrum antibiotics, isolation Work-up pending  Non-STEMI Echocardiogram very concerning for old anterior MI, multivessel disease Troponin of 6 in the setting of chest pain yesterday, now pain-free Risk factors include long history of smoking, family history On heparin infusion. Ideally needs cardiac catheterization We will wait for ID and pulmonary to weigh in If he requires biopsy, bronchoscopy, etc.we will perform cardiac catheterization following these procedures as any intervention would require him to stay on Plavix  --The above was discussed with the patient in detail  Tobacco abuse Cessation recommended   Greater than 50% was spent in counseling and coordination of care with patient Total encounter time 110 minutes or more   Signed: Dossie Warner  M.D., Ph.D. [facility]

## 2018-03-16 NOTE — Progress Notes (Signed)
ANTICOAGULATION CONSULT NOTE  Pharmacy Consult for Heparin  Indication: chest pain/ACS  No Known Allergies  Patient Measurements: Height: 5\' 6"  (167.6 cm) Weight: 135 lb 3.2 oz (61.3 kg) IBW/kg (Calculated) : 63.8 Heparin Dosing Weight:  61.3 kg   Vital Signs: Temp: 98.1 F (36.7 C) (11/06 1516) Temp Source: Oral (11/06 1516) BP: 106/84 (11/06 1516) Pulse Rate: 97 (11/06 1516)  Labs: Recent Labs    03/15/18 1705 03/15/18 2139 03/16/18 0224 03/16/18 0315 03/16/18 0935 03/16/18 1433 03/16/18 1647  HGB 14.3  --  12.6*  --   --   --   --   HCT 42.8  --  37.6*  --   --   --   --   PLT 545*  --  504*  --   --   --   --   APTT  --  34  --   --   --   --   --   LABPROT  --  14.6  --   --   --   --   --   INR  --  1.15  --   --   --   --   --   HEPARINUNFRC  --   --   --  0.16* 0.12*  --  0.35  CREATININE 0.85  --  0.65  --   --   --   --   TROPONINI 0.19*  --  5.66*  --  6.21* 4.61*  --     Estimated Creatinine Clearance: 95.8 mL/min (by C-G formula based on SCr of 0.65 mg/dL).   Medical History: Past Medical History:  Diagnosis Date  . Asthma      Assessment: 52 year old male presented with chest pain. Troponin increase to 6.21. Patient does not take any anticoagulant PTA.  Goal of Therapy:  Heparin level 0.3-0.7 units/ml Monitor platelets by anticoagulation protocol: Yes   Plan:  11/6 1647 HL 0.35. Patient is currently therapeutic  HL ordered for 2300. Will follow up and monitor closely.  CBC with morning labs.  Albina Billet, PharmD Clinical Pharmacist 03/16/2018 6:20 PM

## 2018-03-16 NOTE — Progress Notes (Signed)
*  PRELIMINARY RESULTS* Echocardiogram 2D Echocardiogram has been performed.  Cristela Blue 03/16/2018, 10:09 AM

## 2018-03-16 NOTE — Plan of Care (Signed)
  Problem: Clinical Measurements: Goal: Ability to maintain clinical measurements within normal limits will improve Outcome: Not Progressing Note:  Patient's latest troponin level today is elevated around 6. Informed cardiology P.A. Seen by cardiologist since. Catheterization on hold until TB test deemed negative. Will continue to monitor for testing / lab results. Keith Warner [facility]

## 2018-03-16 NOTE — Progress Notes (Signed)
Pharmacy Antibiotic Note  Keith Warner is a 52 y.o. male admitted on 03/15/2018 with pneumonia.  Pharmacy has been consulted for vanc/ceftriaxone dosing.  Plan: Will continue w/ vanc 1g IV q12h w/ 6 hour stack  Will draw trough 11/07 @ 1500 prior to 4th dose Will continue ceftriaxone 2g IV daily  Ke 0. T1/2 ~ 12 hrs Goal trough 15 - 20 mcg/mL  Height: 5\' 6"  (167.6 cm) Weight: 145 lb (65.8 kg) IBW/kg (Calculated) : 63.8  Temp (24hrs), Avg:98.2 F (36.8 C), Min:98.2 F (36.8 C), Max:98.2 F (36.8 C)  Recent Labs  Lab 03/15/18 1705 03/15/18 2128  WBC 13.6*  --   CREATININE 0.85  --   LATICACIDVEN  --  1.0    Estimated Creatinine Clearance: 93.8 mL/min (by C-G formula based on SCr of 0.85 mg/dL).    No Known Allergies  Thank you for allowing pharmacy to be a part of this patient's care.  Thomasene Ripple, PharmD, BCPS Clinical Pharmacist 03/16/2018

## 2018-03-16 NOTE — H&P (Signed)
[facility] [place] NAME: Keith Warner    MR#:  [id number]  DATE OF BIRTH:  01/10/1966  DATE OF ADMISSION:  03/15/2018  PRIMARY CARE PHYSICIAN: Imagene Riches, NP   REQUESTING/REFERRING PHYSICIAN: Merlyn Lot, MD  CHIEF COMPLAINT:   Chief Complaint  Patient presents with  . Chest Pain    HISTORY OF PRESENT ILLNESS:  Keith Warner  is a 52 y.o. male who presents with chief complaint as above. Patient describes earlier today experiencing 10/10 chest pain that was a "burning" sensation and felt similar to heartburn.  Patient took Tums, began coughing and vomited the Tums up.  Patient attempted to self-medicate with more Tums with the same result.  Patient stated chest pain was unrelieved for hours.  Patient given nitroglycerin on arrival to ED which patient stated gave him a headache, which subsided along with the chest pain.  Patient also reports productive coughing with white/yellow sputum, body aches, intermittent night sweats and fatigue for the past month.  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  Patient denies blood in sputum/emesis.  Patient also denies fever and states weight loss in the last month of 1-2 lbs.  Patient recently completed a prednisone taper and has been taking Mucinex at home without relief.  Patient's chest X-ray showed a right upper lobe 3.7 cm nodular density with CTa confirming presence of a new right upper lobe cavitary lesion.  In ED troponin elevated at 0.19, ED physician spoke with Cardiology who recommended heparin and likely cardiac cath.  PAST MEDICAL HISTORY:   Past Medical History:  Diagnosis Date  . Asthma     PAST SURGICAL HISTORY:   Past Surgical History:  Procedure Laterality Date  . gangrene      SOCIAL HISTORY:   Social History   Tobacco Use  . Smoking status: Current Every  Day Smoker    Types: Cigarettes  . Smokeless tobacco: Never Used  Substance Use Topics  . Alcohol use: Yes     FAMILY HISTORY:   Family History  Problem Relation Age of Onset  . CAD Mother     DRUG ALLERGIES:  No Known Allergies  MEDICATIONS AT HOME:   Prior to Admission medications   Medication Sig Start Date End Date Taking? Authorizing Provider  albuterol (PROVENTIL HFA;VENTOLIN HFA) 108 (90 Base) MCG/ACT inhaler Inhale 1 puff into the lungs every 6 (six) hours as needed for wheezing. 02/27/18  Yes [provider]  bacitracin ointment Apply 1 application topically 2 (two) times daily. Patient not taking: Reported on 03/15/2018 03/01/16   Zenovia Jarred, DO    REVIEW OF SYSTEMS:  Review of Systems  Constitutional: Positive for diaphoresis and malaise/fatigue. Negative for chills, fever and weight loss.       Pt. States intermittent night sweats  HENT: Negative for ear pain, hearing loss and tinnitus.   Eyes: Negative for blurred vision, double vision, pain and redness.  Respiratory: Positive for cough, sputum production, shortness of breath and wheezing. Negative for hemoptysis.   Cardiovascular: Positive for chest pain. Negative for palpitations, orthopnea and leg swelling.  Gastrointestinal: Positive for heartburn, nausea and vomiting. Negative for abdominal pain, constipation and diarrhea.  Genitourinary: Negative for dysuria, frequency and hematuria.  Musculoskeletal: Negative for back pain, joint pain and neck pain.  Skin:       No acne, rash, or lesions  Neurological: Negative for dizziness, tremors, focal weakness and weakness.  Endo/Heme/Allergies: Negative for polydipsia. Does not bruise/bleed easily.  Psychiatric/Behavioral: Negative for depression. The patient is not nervous/anxious and does not have insomnia.      VITAL SIGNS:   Vitals:   03/15/18 2051 03/15/18 2145 03/15/18 2209 03/15/18 2300  BP: (!) 133/94 (!) 129/98 (!) 129/99 102/88   Pulse: 91 88 91 81  Resp: 16 17 20 18   Temp:      TempSrc:      SpO2: 98% 97% 99% 97%  Weight:      Height:       Wt Readings from Last 3 Encounters:  03/15/18 65.8 kg    PHYSICAL EXAMINATION:  Physical Exam  Vitals reviewed. Constitutional: He is oriented to person, place, and time. He appears well-developed and well-nourished. No distress.  HENT:  Head: Normocephalic and atraumatic.  Mouth/Throat: Oropharynx is clear and moist.  Eyes: Pupils are equal, round, and reactive to light. Conjunctivae and EOM are normal. No scleral icterus.  Neck: Normal range of motion. Neck supple. No JVD present. No thyromegaly present.  Cardiovascular: Normal rate, regular rhythm and intact distal pulses. Exam reveals no gallop and no friction rub.  No murmur heard. Respiratory: Effort normal. No respiratory distress. He has wheezes. He has no rales.  GI: Soft. Bowel sounds are normal. He exhibits no distension. There is no tenderness.  Genitourinary:  Genitourinary Comments: Deferred  Musculoskeletal: Normal range of motion. He exhibits no edema.  No arthritis, no gout  Lymphadenopathy:    He has no cervical adenopathy.  Neurological: He is alert and oriented to person, place, and time. No cranial nerve deficit.  No dysarthria, no aphasia  Skin: Skin is warm and dry. No rash noted. No erythema.  Psychiatric: He has a normal mood and affect. His behavior is normal. Judgment and thought content normal.    LABORATORY PANEL:   CBC Recent Labs  Lab 03/15/18 1705  WBC 13.6*  HGB 14.3  HCT 42.8  PLT 545*   ------------------------------------------------------------------------------------------------------------------  Chemistries  Recent Labs  Lab 03/15/18 1705  NA 137  K 3.5  CL 99  CO2 28  GLUCOSE 143*  BUN 10  CREATININE 0.85  CALCIUM 10.0   ------------------------------------------------------------------------------------------------------------------  Cardiac  Enzymes Recent Labs  Lab 03/15/18 1705  TROPONINI 0.19*   ------------------------------------------------------------------------------------------------------------------  RADIOLOGY:  Dg Chest 2 View  Result Date: 03/15/2018 CLINICAL DATA:  Initial evaluation for acute midsternal chest pain. EXAM: CHEST - 2 VIEW COMPARISON:  None. FINDINGS: Cardiac and mediastinal silhouettes are within normal limits. Lungs well inflated. There is an irregular nodular opacity overlying the right suprahilar region measuring approximately 3.7 cm in size. Cavitary change seen just distally within the right lung apex. Irregular architectural distortion present within this region. Irregular curvilinear scarring at the left infrahilar region lungs are otherwise clear. No other focal airspace disease. No pulmonary edema or definite pleural effusion. No pneumothorax. No acute osseous abnormality. IMPRESSION: 3.7 cm nodular density with adjacent cavitary change at the right lung apex, indeterminate. Further assessment with dedicated cross-sectional imaging recommended. Electronically Signed   By: Jeannine Boga M.D.   On: 03/15/2018 17:49   Ct Angio Chest Pe W And/or Wo Contrast  Result Date: 03/15/2018 CLINICAL DATA:  52 year old male with mid sternal and epigastric pain since 2 p.m. today. Abnormal chest x-ray. EXAM: CT ANGIOGRAPHY CHEST WITH CONTRAST TECHNIQUE: Multidetector CT imaging of the chest was performed using the standard protocol during bolus administration of intravenous  contrast. Multiplanar CT image reconstructions and MIPs were obtained to evaluate the vascular anatomy. CONTRAST:  65m ISOVUE-370 IOPAMIDOL (ISOVUE-370) INJECTION 76% COMPARISON:  None. FINDINGS: Cardiovascular: No filling defects are noted in the pulmonary arterial tree to suggest underlying pulmonary embolism. Heart size is normal. There is no significant pericardial fluid, thickening or pericardial calcification. There is aortic  atherosclerosis, as well as atherosclerosis of the great vessels of the mediastinum and the coronary arteries, including calcified atherosclerotic plaque in the left anterior descending and left circumflex coronary arteries. Mediastinum/Nodes: No pathologically enlarged mediastinal or hilar lymph nodes. Right hilar lymph nodes measuring up to 9 mm in short axis (nonspecific). Esophagus is unremarkable in appearance. No axillary lymphadenopathy. Lungs/Pleura: In the posterior aspect of the right upper lobe there are multiple cavitary areas clustered in one region, which demonstrate thick walls, concerning for multifocal cavitary pneumonia. The possibility of cavitary mass is not excluded, but not strongly favored. Adjacent to these lesions there are some areas of peribronchovascular micro nodularity and ground-glass attenuation. Focal area of scarring in the superior segment of the left lower lobe. No pleural effusions. Upper Abdomen: Unremarkable. Musculoskeletal: There are no aggressive appearing lytic or blastic lesions noted in the visualized portions of the skeleton. Review of the MIP images confirms the above findings. IMPRESSION: 1. Findings are favored to reflect cavitary pneumonia in the right upper lobe, as above. Close attention after antimicrobial therapy is recommended to ensure complete resolution of these findings, as the possibility of a cavitary neoplasm is not excluded (but not favored at this time). 2. No evidence of pulmonary embolism. 3. Aortic atherosclerosis, in addition to 2 vessel coronary artery disease. Please note that although the presence of coronary artery calcium documents the presence of coronary artery disease, the severity of this disease and any potential stenosis cannot be assessed on this non-gated CT examination. Assessment for potential risk factor modification, dietary therapy or pharmacologic therapy may be warranted, if clinically indicated. Aortic Atherosclerosis  (ICD10-I70.0). Electronically Signed   By: DVinnie LangtonM.D.   On: 03/15/2018 21:00    EKG:   Orders placed or performed during the hospital encounter of 03/15/18  . EKG 12-Lead  . EKG 12-Lead  . ED EKG within 10 minutes  . ED EKG within 10 minutes    IMPRESSION AND PLAN:  Principal Problem:  Chest pain: Patient troponin elevated at 0.19, CTa showed aortic atherosclerosis with 2 vessel coronary artery disease.  Heparin gtt ordered with pharmacy consult, Troponin levels to be trended overnight, Cardiology consult placed and echo ordered.  Patient admitted to Telemetry with cardiac monitoring.  Active Problems:  Cavitary lung lesion:  CXR positive for 3.7 cm right upper lobe nodular density, CTa positive for right upper lobe cavitary lesion.  MAC/TB infection vs. MRSA PNA, Infectious disease consulted.  Patient placed on Airborne precautions and Quantiferon lab ordered.  Patient receiving Ceftriaxone, Vancomycin and Azithromycin for antibiotic coverage.  Will consider consulting Pulmonology based on results of infectious workup with consideration of cavitary neoplasm..    Asthma:  PRN inhaler ordered  Tobacco use:  Cessation counseling provided   Chart review performed and case discussed with ED provider. Labs, imaging and/or ECG reviewed by provider and discussed with patient/family. Management plans discussed with the patient and/or family.  DVT PROPHYLAXIS: Systemic anticoagulation  GI PROPHYLAXIS:  None  ADMISSION STATUS: Inpatient    CODE STATUS: FULL  TOTAL TIME TAKING CARE OF THIS PATIENT: 40 minutes.   Keith Warner 03/16/2018, 12:57 AM  [place]  [phone]  CC: Primary care physician; Imagene Riches, NP  Note:  This document was prepared using Dragon voice recognition software and may include unintentional dictation errors.

## 2018-03-17 ENCOUNTER — Other Ambulatory Visit: Payer: Self-pay

## 2018-03-17 ENCOUNTER — Encounter
Admission: EM | Disposition: A | Discharge status: Home / Self Care | Payer: Self-pay | Source: Home / Self Care | Attending: Specialist

## 2018-03-17 DIAGNOSIS — I214 Non-ST elevation (NSTEMI) myocardial infarction: Secondary | ICD-10-CM

## 2018-03-17 DIAGNOSIS — I200 Unstable angina: Secondary | ICD-10-CM

## 2018-03-17 DIAGNOSIS — I255 Ischemic cardiomyopathy: Secondary | ICD-10-CM

## 2018-03-17 DIAGNOSIS — I219 Acute myocardial infarction, unspecified: Secondary | ICD-10-CM | POA: Insufficient documentation

## 2018-03-17 DIAGNOSIS — I2510 Atherosclerotic heart disease of native coronary artery without angina pectoris: Secondary | ICD-10-CM

## 2018-03-17 DIAGNOSIS — R7989 Other specified abnormal findings of blood chemistry: Secondary | ICD-10-CM

## 2018-03-17 HISTORY — PX: CORONARY STENT INTERVENTION: CATH118234

## 2018-03-17 HISTORY — PX: LEFT HEART CATH AND CORONARY ANGIOGRAPHY: CATH118249

## 2018-03-17 LAB — HEPARIN LEVEL (UNFRACTIONATED)
HEPARIN UNFRACTIONATED: 0.28 [IU]/mL — AB (ref 0.30–0.70)
Heparin Unfractionated: 0.45 IU/mL (ref 0.30–0.70)

## 2018-03-17 LAB — CBC WITH DIFFERENTIAL/PLATELET
Abs Immature Granulocytes: 0.03 10*3/uL (ref 0.00–0.07)
Basophils Absolute: 0 10*3/uL (ref 0.0–0.1)
Basophils Relative: 1 %
EOS PCT: 2 %
Eosinophils Absolute: 0.1 10*3/uL (ref 0.0–0.5)
HEMATOCRIT: 35.9 % — AB (ref 39.0–52.0)
Hemoglobin: 12.1 g/dL — ABNORMAL LOW (ref 13.0–17.0)
Immature Granulocytes: 1 %
Lymphocytes Relative: 23 %
Lymphs Abs: 1.5 10*3/uL (ref 0.7–4.0)
MCH: 31 pg (ref 26.0–34.0)
MCHC: 33.7 g/dL (ref 30.0–36.0)
MCV: 92.1 fL (ref 80.0–100.0)
MONOS PCT: 11 %
Monocytes Absolute: 0.7 10*3/uL (ref 0.1–1.0)
Neutro Abs: 4.2 10*3/uL (ref 1.7–7.7)
Neutrophils Relative %: 62 %
Platelets: 469 10*3/uL — ABNORMAL HIGH (ref 150–400)
RBC: 3.9 MIL/uL — ABNORMAL LOW (ref 4.22–5.81)
RDW: 13.4 % (ref 11.5–15.5)
WBC: 6.6 10*3/uL (ref 4.0–10.5)
nRBC: 0 % (ref 0.0–0.2)

## 2018-03-17 LAB — EXPECTORATED SPUTUM ASSESSMENT W GRAM STAIN, RFLX TO RESP C

## 2018-03-17 LAB — HIV ANTIBODY (ROUTINE TESTING W REFLEX): HIV Screen 4th Generation wRfx: NONREACTIVE

## 2018-03-17 LAB — EXPECTORATED SPUTUM ASSESSMENT W REFEX TO RESP CULTURE

## 2018-03-17 LAB — POCT ACTIVATED CLOTTING TIME: ACTIVATED CLOTTING TIME: 345 s

## 2018-03-17 LAB — GLUCOSE, CAPILLARY: Glucose-Capillary: 90 mg/dL (ref 70–99)

## 2018-03-17 SURGERY — LEFT HEART CATH AND CORONARY ANGIOGRAPHY
Anesthesia: Moderate Sedation

## 2018-03-17 MED ORDER — SODIUM CHLORIDE 0.9 % WEIGHT BASED INFUSION: 1.0000 mL/kg/h | INTRAVENOUS | Status: DC

## 2018-03-17 MED ORDER — IOPAMIDOL (ISOVUE-300) INJECTION 61%
INTRAVENOUS | Status: DC | PRN
  Administered 2018-03-17: 60 mL via INTRA_ARTERIAL

## 2018-03-17 MED ORDER — ALUM & MAG HYDROXIDE-SIMETH 200-200-20 MG/5ML PO SUSP
30.0000 mL | Freq: Four times a day (QID) | ORAL | Status: DC | PRN
  Administered 2018-03-17: 30 mL via ORAL
  Filled 2018-03-17 (×2): qty 30

## 2018-03-17 MED ORDER — ATORVASTATIN CALCIUM 20 MG PO TABS
80.0000 mg | ORAL_TABLET | Freq: Every day | ORAL | Status: DC
  Administered 2018-03-18: 80 mg via ORAL
  Filled 2018-03-17: qty 4
  Filled 2018-03-17: qty 1

## 2018-03-17 MED ORDER — CLOPIDOGREL BISULFATE 300 MG PO TABS
ORAL_TABLET | ORAL | Status: AC
  Filled 2018-03-17: qty 2

## 2018-03-17 MED ORDER — ASPIRIN 81 MG PO CHEW: 81.0000 mg | CHEWABLE_TABLET | ORAL | Status: DC

## 2018-03-17 MED ORDER — SODIUM CHLORIDE 0.9 % IV SOLN: 0.2000 mg/kg/h | INTRAVENOUS | Status: AC

## 2018-03-17 MED ORDER — CLOPIDOGREL BISULFATE 75 MG PO TABS
ORAL_TABLET | ORAL | Status: DC | PRN
  Administered 2018-03-17: 600 mg via ORAL

## 2018-03-17 MED ORDER — IOPAMIDOL (ISOVUE-300) INJECTION 61%
INTRAVENOUS | Status: DC | PRN
  Administered 2018-03-17: 100 mL via INTRA_ARTERIAL

## 2018-03-17 MED ORDER — BIVALIRUDIN TRIFLUOROACETATE 250 MG IV SOLR
INTRAVENOUS | Status: AC
  Filled 2018-03-17: qty 250

## 2018-03-17 MED ORDER — ASPIRIN 81 MG PO CHEW
CHEWABLE_TABLET | ORAL | Status: AC
  Filled 2018-03-17: qty 3

## 2018-03-17 MED ORDER — HEPARIN BOLUS VIA INFUSION
1000.0000 [IU] | Freq: Once | INTRAVENOUS | Status: AC
  Administered 2018-03-17: 1000 [IU] via INTRAVENOUS
  Filled 2018-03-17: qty 1000

## 2018-03-17 MED ORDER — HEPARIN BOLUS VIA INFUSION
900.0000 [IU] | Freq: Once | INTRAVENOUS | Status: AC
  Administered 2018-03-17: 900 [IU] via INTRAVENOUS
  Filled 2018-03-17: qty 900

## 2018-03-17 MED ORDER — SODIUM CHLORIDE 0.9 % IV SOLN
250.0000 mL | INTRAVENOUS | Status: DC | PRN
  Administered 2018-03-18 – 2018-03-19 (×4): 250 mL via INTRAVENOUS

## 2018-03-17 MED ORDER — FENTANYL CITRATE (PF) 100 MCG/2ML IJ SOLN
INTRAMUSCULAR | Status: DC | PRN
  Administered 2018-03-17: 25 ug via INTRAVENOUS

## 2018-03-17 MED ORDER — SODIUM CHLORIDE 0.9% FLUSH
3.0000 mL | Freq: Two times a day (BID) | INTRAVENOUS | Status: DC
  Administered 2018-03-17 – 2018-03-18 (×3): 3 mL via INTRAVENOUS

## 2018-03-17 MED ORDER — ASPIRIN 81 MG PO CHEW
CHEWABLE_TABLET | ORAL | Status: DC | PRN
  Administered 2018-03-17: 243 mg via ORAL

## 2018-03-17 MED ORDER — HEPARIN (PORCINE) IN NACL 1000-0.9 UT/500ML-% IV SOLN
INTRAVENOUS | Status: AC
  Filled 2018-03-17: qty 1000

## 2018-03-17 MED ORDER — BIVALIRUDIN BOLUS VIA INFUSION - CUPID
INTRAVENOUS | Status: DC | PRN
  Administered 2018-03-17: 47.55 mg via INTRAVENOUS

## 2018-03-17 MED ORDER — FENTANYL CITRATE (PF) 100 MCG/2ML IJ SOLN
INTRAMUSCULAR | Status: AC
  Filled 2018-03-17: qty 2

## 2018-03-17 MED ORDER — NITROGLYCERIN 5 MG/ML IV SOLN
INTRAVENOUS | Status: AC
  Filled 2018-03-17: qty 10

## 2018-03-17 MED ORDER — MIDAZOLAM HCL 2 MG/2ML IJ SOLN
INTRAMUSCULAR | Status: DC | PRN
  Administered 2018-03-17: 0.5 mg via INTRAVENOUS

## 2018-03-17 MED ORDER — BIVALIRUDIN BOLUS VIA INFUSION - CUPID: INTRAVENOUS | Status: DC | PRN

## 2018-03-17 MED ORDER — SODIUM CHLORIDE 0.9% FLUSH: 3.0000 mL | INTRAVENOUS | Status: DC | PRN

## 2018-03-17 MED ORDER — MIDAZOLAM HCL 2 MG/2ML IJ SOLN
INTRAMUSCULAR | Status: AC
  Filled 2018-03-17: qty 2

## 2018-03-17 MED ORDER — SODIUM CHLORIDE 0.9 % WEIGHT BASED INFUSION: 3.0000 mL/kg/h | INTRAVENOUS | Status: DC

## 2018-03-17 MED ORDER — CLOPIDOGREL BISULFATE 75 MG PO TABS
75.0000 mg | ORAL_TABLET | Freq: Every day | ORAL | Status: DC
  Administered 2018-03-18 – 2018-03-19 (×2): 75 mg via ORAL
  Filled 2018-03-17 (×2): qty 1

## 2018-03-17 MED ORDER — SODIUM CHLORIDE 0.9 % IV SOLN
INTRAVENOUS | Status: AC | PRN
  Administered 2018-03-17: 1.75 mg/kg/h via INTRAVENOUS

## 2018-03-17 SURGICAL SUPPLY — 17 items
BALLN TREK RX 2.5X12 (BALLOONS) ×3
BALLOON TREK RX 2.5X12 (BALLOONS) ×1 IMPLANT
CATH INFINITI 5FR ANG PIGTAIL (CATHETERS) ×6 IMPLANT
CATH INFINITI 5FR JL4 (CATHETERS) ×3 IMPLANT
CATH INFINITI JR4 5F (CATHETERS) ×3 IMPLANT
CATH VISTA GUIDE 6FR XBLAD3.5 (CATHETERS) ×3 IMPLANT
DEVICE CLOSURE MYNXGRIP 5F (Vascular Products) IMPLANT
DEVICE CLOSURE MYNXGRIP 6/7F (Vascular Products) ×3 IMPLANT
DEVICE INFLAT 30 PLUS (MISCELLANEOUS) ×3 IMPLANT
KIT MANI 3VAL PERCEP (MISCELLANEOUS) ×3 IMPLANT
NEEDLE PERC 18GX7CM (NEEDLE) ×3 IMPLANT
PACK CARDIAC CATH (CUSTOM PROCEDURE TRAY) ×3 IMPLANT
SHEATH AVANTI 5FR X 11CM (SHEATH) ×3 IMPLANT
SHEATH AVANTI 6FR X 11CM (SHEATH) ×3 IMPLANT
STENT RESOLUTE ONYX 3.0X26 (Permanent Stent) ×3 IMPLANT
WIRE GUIDERIGHT .035X150 (WIRE) ×3 IMPLANT
WIRE RUNTHROUGH .014X180CM (WIRE) ×3 IMPLANT

## 2018-03-17 NOTE — Progress Notes (Signed)
Patient remains clinically stable post heart cath with Dr Kirke Corin placing stent to LAD, right groin without bleeding nor hematoma. Family members at bedside. Denies complaints at this time. angiomax gtt infusion on as per orders.orders to go to CCU stepdown for angiomax infusion, prior to returning to pt's room 233 isolation after.

## 2018-03-17 NOTE — Progress Notes (Signed)
Progress Note  Patient Name: Keith Warner Date of Encounter: 03/17/2018  Primary Cardiologist: [name]  Subjective   Reports having episodes of chest pain today Concerning him for angina Continued cough Recommendation from ID to proceed with cardiac work-up  Inpatient Medications    Scheduled Meds: . [MAR Hold] aspirin EC  81 mg Oral Daily  . atorvastatin  80 mg Oral q1800  . [START ON 03/18/2018] clopidogrel  75 mg Oral Q breakfast  . sodium chloride flush  3 mL Intravenous Q12H   Continuous Infusions: . sodium chloride    . [MAR Hold] azithromycin Stopped (03/17/18 0715)  . bivalirudin (ANGIOMAX) infusion 5 mg/mL (Cath Lab,ACS,PCI indication)    . [MAR Hold] cefTRIAXone (ROCEPHIN)  IV Stopped (03/17/18 0715)  . [MAR Hold] metronidazole 500 mg (03/17/18 1353)   PRN Meds: sodium chloride, [MAR Hold] acetaminophen **OR** [MAR Hold] acetaminophen, [MAR Hold] albuterol, [MAR Hold] alum & mag hydroxide-simeth, [MAR Hold] nitroGLYCERIN, [MAR Hold] ondansetron **OR** [MAR Hold] ondansetron (ZOFRAN) IV, sodium chloride flush   Vital Signs    Vitals:   03/17/18 1701 03/17/18 1745 03/17/18 1800 03/17/18 1815  BP: 108/76 (!) 118/93 (!) 113/98 (!) 110/92  Pulse: 88 93 97 90  Resp: 20 20 20 16   Temp:      TempSrc:      SpO2: 95% 98% 100% 100%  Weight:      Height:        Intake/Output Summary (Last 24 hours) at 03/17/2018 1836 Last data filed at 03/17/2018 1610 Gross per 24 hour  Intake 591.17 ml  Output -  Net 591.17 ml   Filed Weights   03/15/18 1703 03/16/18 0212 03/17/18 0317  Weight: 65.8 kg 61.3 kg 63.4 kg    Telemetry    Normal sinus rhythm- Personally Reviewed  ECG    - Personally Reviewed  Physical Exam   Constitutional:  oriented to person, place, and time. No distress.  HENT:  Head: Grossly normal Eyes:  no discharge. No scleral icterus.  Neck: No JVD, no carotid bruits  Cardiovascular: Regular rate and rhythm, no murmurs  appreciated Pulmonary/Chest: Clear to auscultation bilaterally, no wheezes or rails Abdominal: Soft.  no distension.  no tenderness.  Musculoskeletal: Normal range of motion Neurological:  normal muscle tone. Coordination normal. No atrophy Skin: Skin warm and dry Psychiatric: normal affect, pleasant   Labs    Chemistry Recent Labs  Lab 03/15/18 1705 03/16/18 0224 03/16/18 1433  NA 137 138  --   K 3.5 3.8  --   CL 99 102  --   CO2 28 26  --   GLUCOSE 143* 113*  --   BUN 10 8  --   CREATININE 0.85 0.65  --   CALCIUM 10.0 9.2  --   PROT  --   --  7.1  ALBUMIN  --   --  3.0*  AST  --   --  36  ALT  --   --  11  ALKPHOS  --   --  54  BILITOT  --   --  1.0  GFRNONAA >60 >60  --   GFRAA >60 >60  --   ANIONGAP 10 10  --      Hematology Recent Labs  Lab 03/15/18 1705 03/16/18 0224 03/17/18 0650  WBC 13.6* 10.5 6.6  RBC 4.59 4.05* 3.90*  HGB 14.3 12.6* 12.1*  HCT 42.8 37.6* 35.9*  MCV 93.2 92.8 92.1  MCH 31.2 31.1 31.0  MCHC  33.4 33.5 33.7  RDW 13.2 13.2 13.4  PLT 545* 504* 469*    Cardiac Enzymes Recent Labs  Lab 03/16/18 0224 03/16/18 0935 03/16/18 1433 03/16/18 1959  TROPONINI 5.66* 6.21* 4.61* 3.82*   No results for input(s): TROPIPOC in the last 168 hours.   BNPNo results for input(s): BNP, PROBNP in the last 168 hours.   DDimer No results for input(s): DDIMER in the last 168 hours.   Radiology    Ct Angio Chest Pe W And/or Wo Contrast  Result Date: 03/15/2018 CLINICAL DATA:  53 year old male with mid sternal and epigastric pain since 2 p.m. today. Abnormal chest x-ray. EXAM: CT ANGIOGRAPHY CHEST WITH CONTRAST TECHNIQUE: Multidetector CT imaging of the chest was performed using the standard protocol during bolus administration of intravenous contrast. Multiplanar CT image reconstructions and MIPs were obtained to evaluate the vascular anatomy. CONTRAST:  75mL ISOVUE-370 IOPAMIDOL (ISOVUE-370) INJECTION 76% COMPARISON:  None. FINDINGS: Cardiovascular:  No filling defects are noted in the pulmonary arterial tree to suggest underlying pulmonary embolism. Heart size is normal. There is no significant pericardial fluid, thickening or pericardial calcification. There is aortic atherosclerosis, as well as atherosclerosis of the great vessels of the mediastinum and the coronary arteries, including calcified atherosclerotic plaque in the left anterior descending and left circumflex coronary arteries. Mediastinum/Nodes: No pathologically enlarged mediastinal or hilar lymph nodes. Right hilar lymph nodes measuring up to 9 mm in short axis (nonspecific). Esophagus is unremarkable in appearance. No axillary lymphadenopathy. Lungs/Pleura: In the posterior aspect of the right upper lobe there are multiple cavitary areas clustered in one region, which demonstrate thick walls, concerning for multifocal cavitary pneumonia. The possibility of cavitary mass is not excluded, but not strongly favored. Adjacent to these lesions there are some areas of peribronchovascular micro nodularity and ground-glass attenuation. Focal area of scarring in the superior segment of the left lower lobe. No pleural effusions. Upper Abdomen: Unremarkable. Musculoskeletal: There are no aggressive appearing lytic or blastic lesions noted in the visualized portions of the skeleton. Review of the MIP images confirms the above findings. IMPRESSION: 1. Findings are favored to reflect cavitary pneumonia in the right upper lobe, as above. Close attention after antimicrobial therapy is recommended to ensure complete resolution of these findings, as the possibility of a cavitary neoplasm is not excluded (but not favored at this time). 2. No evidence of pulmonary embolism. 3. Aortic atherosclerosis, in addition to 2 vessel coronary artery disease. Please note that although the presence of coronary artery calcium documents the presence of coronary artery disease, the severity of this disease and any potential  stenosis cannot be assessed on this non-gated CT examination. Assessment for potential risk factor modification, dietary therapy or pharmacologic therapy may be warranted, if clinically indicated. Aortic Atherosclerosis (ICD10-I70.0). Electronically Signed   By: Trudie Reed M.D.   On: 03/15/2018 21:00    Cardiac Studies   Echocardiogram Cardiomyopathy, dilated, global hypokinesis with severe high hypokinesis anterior wall   Patient Profile     52 year old gentleman with long tree of smoking, hyperlipidemia, asthma, presenting to the hospital with acute onset chest pain, burning in his upper chest, coughing over the past 3 to 4 weeks. 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 --- Discuss timing of ischemic work-up, cardiac catheterization He would like to get this done ASAP otherwise would like to leave the hospital and have it done at a later date I have reviewed the risks, indications, and alternatives to cardiac catheterization, possible angioplasty, and stenting with the patient. Risks include but are not limited to bleeding, infection, vascular injury, stroke, myocardial infection, arrhythmia, kidney injury,  radiation-related injury in the case of prolonged fluoroscopy use, emergency cardiac surgery, and death. The patient understands the risks of serious complication is 1-2 in 1000 with diagnostic cardiac cath and 1-2% or less with angioplasty/stenting.  We will schedule cardiac catheterization this afternoon at 3 PM  Tobacco abuse Cessation recommended Does not need a nicotine patch  Case was discussed with infection control given he has not ruled out for TB Discussed with cardiac catheterization lab, procedures put in place for special masks  Total encounter time more than 35 minutes  Greater than 50% was spent in counseling and coordination of care with the patient   For questions or updates, please contact [facility] Please consult [email or web address] for contact info under        Signed, Julien Nordmann, MD  03/17/2018, 6:36 PM

## 2018-03-17 NOTE — Progress Notes (Signed)
ANTICOAGULATION CONSULT NOTE  Pharmacy Consult for Heparin  Indication: chest pain/ACS  No Known Allergies  Patient Measurements: Height: 5\' 6"  (167.6 cm) Weight: 139 lb 12.4 oz (63.4 kg) IBW/kg (Calculated) : 63.8 Heparin Dosing Weight:  61.3 kg   Vital Signs: Temp: 98.2 F (36.8 C) (11/07 1134) Temp Source: Oral (11/07 1134) BP: 107/86 (11/07 1207) Pulse Rate: 90 (11/07 1207)  Labs: Recent Labs    03/15/18 1705 03/15/18 2139 03/16/18 0224  03/16/18 0935 03/16/18 1433  03/16/18 1959 03/16/18 2318 03/17/18 0650 03/17/18 1400  HGB 14.3  --  12.6*  --   --   --   --   --   --  12.1*  --   HCT 42.8  --  37.6*  --   --   --   --   --   --  35.9*  --   PLT 545*  --  504*  --   --   --   --   --   --  469*  --   APTT  --  34  --   --   --   --   --   --   --   --   --   LABPROT  --  14.6  --   --   --   --   --   --   --   --   --   INR  --  1.15  --   --   --   --   --   --   --   --   --   HEPARINUNFRC  --   --   --    < > 0.12*  --    < >  --  0.20* 0.28* 0.45  CREATININE 0.85  --  0.65  --   --   --   --   --   --   --   --   TROPONINI 0.19*  --  5.66*  --  6.21* 4.61*  --  3.82*  --   --   --    < > = values in this interval not displayed.    Estimated Creatinine Clearance: 99.1 mL/min (by C-G formula based on SCr of 0.65 mg/dL).   Medical History: Past Medical History:  Diagnosis Date  . Asthma      Assessment: 52 year old male presented with chest pain. Troponin increase to 6.21. Patient does not take any anticoagulant PTA.  Goal of Therapy:  Heparin level 0.3-0.7 units/ml Monitor platelets by anticoagulation protocol: Yes   Plan:  11/7 1400 HL 0.45 therapeutic. Continue heparin drip at 1700 units/hr. Heparin level ordered for 2000 to confirm. CBC with morning labs.  Pricilla Riffle, PharmD Pharmacy Resident  03/17/2018 2:44 PM

## 2018-03-17 NOTE — Consult Note (Signed)
Pharmacy consulted to dose bivalrudin @ 0.2mg /kg/hr x 4 hrs.  Called RN vickie bc MAR said dip was at 0.25mg /kg/hr. Said she would make sure it was running at correct rate which would equate to 2.4ml/hr Entered order w/ stop time of 2045 since rate was decreased at 1645 per [facility], Pharm.D, BCPS Clinical Pharmacist

## 2018-03-17 NOTE — Progress Notes (Signed)
Cardiac catheterization this afternoon Respiratory isolation procedures utilized  Severe proximal LAD lesion noted , close to 99% Successfully stented by Dr. Reita Cliche procedure well without complications On aspirin Plavix, Angiomax  Signed, Dossie Arbour, MD, Ph.D [facility]

## 2018-03-17 NOTE — Progress Notes (Signed)
Pt transferred from telemetry unit post cardiac catheterization this afternoon which revealed severe proximal LAD lesion 99% occlusion requiring drug eluting stent.  Requiring transfer to stepdown unit for closer monitoring for angiomax gtt which will be completed at 2045 on 03/17/18 he will transfer back to telemetry unit.  Pt currently stable PCCM will monitor pt while in stepdown unit.  Sonda Rumble, AGNP  Pulmonary/Critical Care Pager [phone] (please enter 7 digits) PCCM Consult Pager [phone] (please enter 7 digits)

## 2018-03-17 NOTE — Progress Notes (Signed)
Patient remains clinically stable post procedure. Right groin without bleeding nor hematoma. Report called to [facility] with plan reviewed. Denies complaints at this time. Sinus rhythm per monitor.

## 2018-03-17 NOTE — Progress Notes (Signed)
[employer] - [place] at [facility]      PATIENT NAME: Keith Warner    MR#:  [id number]  DATE OF BIRTH:  [birth year]  SUBJECTIVE:   No acute events overnight, patient feels better.  Denies any hemoptysis, denies any chest pain, nausea or vomiting.  REVIEW OF SYSTEMS:    Review of Systems  Constitutional: Negative for chills and fever.  HENT: Negative for congestion and tinnitus.   Eyes: Negative for blurred vision and double vision.  Respiratory: Negative for cough, shortness of breath and wheezing.   Cardiovascular: Negative for chest pain, orthopnea and PND.  Gastrointestinal: Negative for abdominal pain, diarrhea, nausea and vomiting.  Genitourinary: Negative for dysuria and hematuria.  Neurological: Negative for dizziness, sensory change and focal weakness.  All other systems reviewed and are negative.   Nutrition: Heart healthy Tolerating Diet: Yes Tolerating PT:  Ambulatory   DRUG ALLERGIES:  No Known Allergies  VITALS:  Blood pressure 107/86, pulse 90, temperature 98.2 F (36.8 C), temperature source Oral, resp. rate 16, height 5\' 6"  (1.676 m), weight 63.4 kg, SpO2 98 %.  PHYSICAL EXAMINATION:   Physical Exam  GENERAL:  52 y.o.-year-old patient lying in bed in no acute distress.  EYES: Pupils equal, round, reactive to light and accommodation. No scleral icterus. Extraocular muscles intact.  HEENT: Head atraumatic, normocephalic. Oropharynx and nasopharynx clear.  NECK:  Supple, no jugular venous distention. No thyroid enlargement, no tenderness.  LUNGS: Normal breath sounds bilaterally, no wheezing, rales, Few Rhonchi in R upper lung fields area. No use of accessory muscles of respiration.  CARDIOVASCULAR: S1, S2 normal. No murmurs, rubs, or gallops.  ABDOMEN: Soft, nontender, nondistended. Bowel sounds present. No organomegaly or mass.  EXTREMITIES: No cyanosis, clubbing or edema b/l.    NEUROLOGIC: Cranial nerves II through XII are  intact. No focal Motor or sensory deficits b/l.   PSYCHIATRIC: The patient is alert and oriented x 3.  SKIN: No obvious rash, lesion, or ulcer.    LABORATORY PANEL:   CBC Recent Labs  Lab 03/17/18 0650  WBC 6.6  HGB 12.1*  HCT 35.9*  PLT 469*   ------------------------------------------------------------------------------------------------------------------  Chemistries  Recent Labs  Lab 03/16/18 0224 03/16/18 1433  NA 138  --   K 3.8  --   CL 102  --   CO2 26  --   GLUCOSE 113*  --   BUN 8  --   CREATININE 0.65  --   CALCIUM 9.2  --   MG  --  1.9  AST  --  36  ALT  --  11  ALKPHOS  --  54  BILITOT  --  1.0   ------------------------------------------------------------------------------------------------------------------  Cardiac Enzymes Recent Labs  Lab 03/16/18 1959  TROPONINI 3.82*   ------------------------------------------------------------------------------------------------------------------  RADIOLOGY:  Dg Chest 2 View  Result Date: 03/15/2018 CLINICAL DATA:  Initial evaluation for acute midsternal chest pain. EXAM: CHEST - 2 VIEW COMPARISON:  None. FINDINGS: Cardiac and mediastinal silhouettes are within normal limits. Lungs well inflated. There is an irregular nodular opacity overlying the right suprahilar region measuring approximately 3.7 cm in size. Cavitary change seen just distally within the right lung apex. Irregular architectural distortion present within this region. Irregular curvilinear scarring at the left infrahilar region lungs are otherwise clear. No other focal airspace disease. No pulmonary edema or definite pleural effusion. No pneumothorax. No acute osseous abnormality. IMPRESSION: 3.7 cm nodular density with adjacent cavitary change at the right lung apex, indeterminate. Further assessment with dedicated  cross-sectional imaging recommended. Electronically Signed   By: Rise Mu M.D.   On: 03/15/2018 17:49   Ct Angio Chest  Pe W And/or Wo Contrast  Result Date: 03/15/2018 CLINICAL DATA:  52 year old male with mid sternal and epigastric pain since 2 p.m. today. Abnormal chest x-ray. EXAM: CT ANGIOGRAPHY CHEST WITH CONTRAST TECHNIQUE: Multidetector CT imaging of the chest was performed using the standard protocol during bolus administration of intravenous contrast. Multiplanar CT image reconstructions and MIPs were obtained to evaluate the vascular anatomy. CONTRAST:  75mL ISOVUE-370 IOPAMIDOL (ISOVUE-370) INJECTION 76% COMPARISON:  None. FINDINGS: Cardiovascular: No filling defects are noted in the pulmonary arterial tree to suggest underlying pulmonary embolism. Heart size is normal. There is no significant pericardial fluid, thickening or pericardial calcification. There is aortic atherosclerosis, as well as atherosclerosis of the great vessels of the mediastinum and the coronary arteries, including calcified atherosclerotic plaque in the left anterior descending and left circumflex coronary arteries. Mediastinum/Nodes: No pathologically enlarged mediastinal or hilar lymph nodes. Right hilar lymph nodes measuring up to 9 mm in short axis (nonspecific). Esophagus is unremarkable in appearance. No axillary lymphadenopathy. Lungs/Pleura: In the posterior aspect of the right upper lobe there are multiple cavitary areas clustered in one region, which demonstrate thick walls, concerning for multifocal cavitary pneumonia. The possibility of cavitary mass is not excluded, but not strongly favored. Adjacent to these lesions there are some areas of peribronchovascular micro nodularity and ground-glass attenuation. Focal area of scarring in the superior segment of the left lower lobe. No pleural effusions. Upper Abdomen: Unremarkable. Musculoskeletal: There are no aggressive appearing lytic or blastic lesions noted in the visualized portions of the skeleton. Review of the MIP images confirms the above findings. IMPRESSION: 1. Findings are  favored to reflect cavitary pneumonia in the right upper lobe, as above. Close attention after antimicrobial therapy is recommended to ensure complete resolution of these findings, as the possibility of a cavitary neoplasm is not excluded (but not favored at this time). 2. No evidence of pulmonary embolism. 3. Aortic atherosclerosis, in addition to 2 vessel coronary artery disease. Please note that although the presence of coronary artery calcium documents the presence of coronary artery disease, the severity of this disease and any potential stenosis cannot be assessed on this non-gated CT examination. Assessment for potential risk factor modification, dietary therapy or pharmacologic therapy may be warranted, if clinically indicated. Aortic Atherosclerosis (ICD10-I70.0). Electronically Signed   By: Trudie Reed M.D.   On: 03/15/2018 21:00     ASSESSMENT AND PLAN:   52 year old male with past medical history of asthma, ongoing tobacco abuse who presents to the hospital due to chest pain.  1.  Right upper lobe cavitary pneumonia- patient presented to the hospital with chest pain underwent CT of the chest which was suggestive of a right upper lobe cavitary pneumonia.  Patient has no previous history of tuberculosis. -Continue airborne precautions for now await QuantiFERON testing,  AFB smears have been ordered and are still pending.  -Appreciate infectious disease input.  This is likely necrotizing cavitary pneumonia unlikely tuberculosis. - Continue ceftriaxone, Zithromax, Flagyl.  Off Vanc as MRSA PCR is (-).  - appreciate Pulm. Input and cont. Current care for now.   2.  Non-ST elevation MI-patient has ruled in by cardiac markers.  He presented with chest pain and his troponins have trended upwards. -Seen by cardiology, echocardiogram done which showed diffuse hypokinesis with ejection fraction of 35%. - Continue aspirin, heparin drip.  Discussed with cardiology today and  then plan on doing a  cardiac catheterization this afternoon.   All the records are reviewed and case discussed with Care Management/Social Worker. Management plans discussed with the patient, family and they are in agreement.  CODE STATUS: Full code  DVT Prophylaxis: Heparin gtt  TOTAL TIME TAKING CARE OF THIS PATIENT: 35 minutes.   POSSIBLE D/C IN 1-2 DAYS, DEPENDING ON CLINICAL CONDITION.   Houston Siren M.D on 03/17/2018 at 2:12 PM  Between 7am to 6pm - Pager - [phone]  After 6pm go to [email or web address] - Scientist, research (life sciences) [facility]  Office  [phone]  CC: Primary care physician; [name], NP

## 2018-03-17 NOTE — Plan of Care (Signed)
  Problem: Education: Goal: Knowledge of General Education information will improve Description Including pain rating scale, medication(s)/side effects and non-pharmacologic comfort measures 03/17/2018 0051 by Myles Gip, RN Outcome: Progressing 03/17/2018 0032 by Myles Gip, RN Outcome: Progressing   Problem: Health Behavior/Discharge Planning: Goal: Ability to manage health-related needs will improve 03/17/2018 0051 by Myles Gip, RN Outcome: Progressing 03/17/2018 0032 by Myles Gip, RN Outcome: Progressing   Problem: Clinical Measurements: Goal: Ability to maintain clinical measurements within normal limits will improve 03/17/2018 0051 by Myles Gip, RN Outcome: Progressing 03/17/2018 0032 by Myles Gip, RN Outcome: Progressing   Problem: Education: Goal: Knowledge of General Education information will improve Description Including pain rating scale, medication(s)/side effects and non-pharmacologic comfort measures 03/17/2018 0051 by Myles Gip, RN Outcome: Progressing 03/17/2018 0032 by Myles Gip, RN Outcome: Progressing   Problem: Clinical Measurements: Goal: Ability to maintain clinical measurements within normal limits will improve 03/17/2018 0051 by Myles Gip, RN Outcome: Progressing 03/17/2018 0032 by Myles Gip, RN Outcome: Progressing

## 2018-03-17 NOTE — Progress Notes (Signed)
ANTICOAGULATION CONSULT NOTE  Pharmacy Consult for Heparin  Indication: chest pain/ACS  No Known Allergies  Patient Measurements: Height: 5\' 6"  (167.6 cm) Weight: 135 lb 3.2 oz (61.3 kg) IBW/kg (Calculated) : 63.8 Heparin Dosing Weight:  61.3 kg   Vital Signs: Temp: 98.8 F (37.1 C) (11/06 2012) Temp Source: Oral (11/06 2012) BP: 101/78 (11/06 2012) Pulse Rate: 97 (11/06 2012)  Labs: Recent Labs    03/15/18 1705 03/15/18 2139 03/16/18 0224  03/16/18 0935 03/16/18 1433 03/16/18 1647 03/16/18 1959 03/16/18 2318  HGB 14.3  --  12.6*  --   --   --   --   --   --   HCT 42.8  --  37.6*  --   --   --   --   --   --   PLT 545*  --  504*  --   --   --   --   --   --   APTT  --  34  --   --   --   --   --   --   --   LABPROT  --  14.6  --   --   --   --   --   --   --   INR  --  1.15  --   --   --   --   --   --   --   HEPARINUNFRC  --   --   --    < > 0.12*  --  0.35  --  0.20*  CREATININE 0.85  --  0.65  --   --   --   --   --   --   TROPONINI 0.19*  --  5.66*  --  6.21* 4.61*  --  3.82*  --    < > = values in this interval not displayed.    Estimated Creatinine Clearance: 95.8 mL/min (by C-G formula based on SCr of 0.65 mg/dL).   Medical History: Past Medical History:  Diagnosis Date  . Asthma      Assessment: 52 year old male presented with chest pain. Troponin increase to 6.21. Patient does not take any anticoagulant PTA.  Goal of Therapy:  Heparin level 0.3-0.7 units/ml Monitor platelets by anticoagulation protocol: Yes   Plan:  11/06 @ 2318 HL 0.20 subtherapeutic. Will rebolus w/ heparin 1000 units IV x 1 and increase rate to 1600 units/hr and will recheck HL @ 0700, will f/u on CBC w/ am labs.  Thomasene Ripple, PharmD Clinical Pharmacist 03/17/2018 1:04 AM

## 2018-03-17 NOTE — Progress Notes (Signed)
ANTICOAGULATION CONSULT NOTE  Pharmacy Consult for Heparin  Indication: chest pain/ACS  No Known Allergies  Patient Measurements: Height: 5\' 6"  (167.6 cm) Weight: 139 lb 12.4 oz (63.4 kg) IBW/kg (Calculated) : 63.8 Heparin Dosing Weight:  61.3 kg   Vital Signs: Temp: 98.2 F (36.8 C) (11/07 0758) Temp Source: Oral (11/07 0758) BP: 94/71 (11/07 0758) Pulse Rate: 80 (11/07 0758)  Labs: Recent Labs    03/15/18 1705 03/15/18 2139 03/16/18 0224  03/16/18 0935 03/16/18 1433 03/16/18 1647 03/16/18 1959 03/16/18 2318 03/17/18 0650  HGB 14.3  --  12.6*  --   --   --   --   --   --  12.1*  HCT 42.8  --  37.6*  --   --   --   --   --   --  35.9*  PLT 545*  --  504*  --   --   --   --   --   --  469*  APTT  --  34  --   --   --   --   --   --   --   --   LABPROT  --  14.6  --   --   --   --   --   --   --   --   INR  --  1.15  --   --   --   --   --   --   --   --   HEPARINUNFRC  --   --   --    < > 0.12*  --  0.35  --  0.20* 0.28*  CREATININE 0.85  --  0.65  --   --   --   --   --   --   --   TROPONINI 0.19*  --  5.66*  --  6.21* 4.61*  --  3.82*  --   --    < > = values in this interval not displayed.    Estimated Creatinine Clearance: 99.1 mL/min (by C-G formula based on SCr of 0.65 mg/dL).   Medical History: Past Medical History:  Diagnosis Date  . Asthma      Assessment: 52 year old male presented with chest pain. Troponin increase to 6.21. Patient does not take any anticoagulant PTA.  Goal of Therapy:  Heparin level 0.3-0.7 units/ml Monitor platelets by anticoagulation protocol: Yes   Plan:  11/6 0650 HL 0.28 subtherapeutic. Heparin 900 unit bolus x 1 and increase heparin drip to 1700 units/hr. Heparin level ordered for 1400. CBC with morning labs.  Pricilla Riffle, PharmD Pharmacy Resident  03/17/2018 9:38 AM

## 2018-03-17 NOTE — Plan of Care (Signed)

## 2018-03-18 ENCOUNTER — Encounter: Payer: Self-pay | Admitting: Cardiovascular Disease

## 2018-03-18 DIAGNOSIS — Z955 Presence of coronary angioplasty implant and graft: Secondary | ICD-10-CM

## 2018-03-18 DIAGNOSIS — Z79899 Other long term (current) drug therapy: Secondary | ICD-10-CM

## 2018-03-18 DIAGNOSIS — Z7982 Long term (current) use of aspirin: Secondary | ICD-10-CM

## 2018-03-18 DIAGNOSIS — Z7902 Long term (current) use of antithrombotics/antiplatelets: Secondary | ICD-10-CM

## 2018-03-18 LAB — CBC WITH DIFFERENTIAL/PLATELET
Abs Immature Granulocytes: 0.05 10*3/uL (ref 0.00–0.07)
BASOS PCT: 0 %
Basophils Absolute: 0 10*3/uL (ref 0.0–0.1)
EOS ABS: 0.1 10*3/uL (ref 0.0–0.5)
Eosinophils Relative: 2 %
HCT: 35 % — ABNORMAL LOW (ref 39.0–52.0)
Hemoglobin: 11.8 g/dL — ABNORMAL LOW (ref 13.0–17.0)
Immature Granulocytes: 1 %
Lymphocytes Relative: 16 %
Lymphs Abs: 1.1 10*3/uL (ref 0.7–4.0)
MCH: 30.6 pg (ref 26.0–34.0)
MCHC: 33.7 g/dL (ref 30.0–36.0)
MCV: 90.9 fL (ref 80.0–100.0)
Monocytes Absolute: 0.7 10*3/uL (ref 0.1–1.0)
Monocytes Relative: 10 %
Neutro Abs: 5 10*3/uL (ref 1.7–7.7)
Neutrophils Relative %: 71 %
PLATELETS: 449 10*3/uL — AB (ref 150–400)
RBC: 3.85 MIL/uL — AB (ref 4.22–5.81)
RDW: 13.2 % (ref 11.5–15.5)
WBC: 7.1 10*3/uL (ref 4.0–10.5)
nRBC: 0 % (ref 0.0–0.2)

## 2018-03-18 LAB — BASIC METABOLIC PANEL
ANION GAP: 6 (ref 5–15)
BUN: <5 mg/dL — ABNORMAL LOW (ref 6–20)
CALCIUM: 8.3 mg/dL — AB (ref 8.9–10.3)
CO2: 25 mmol/L (ref 22–32)
Chloride: 104 mmol/L (ref 98–111)
Creatinine, Ser: 0.66 mg/dL (ref 0.61–1.24)
GFR calc non Af Amer: >60 mL/min (ref >60)
GLUCOSE: 104 mg/dL — AB (ref 70–99)
POTASSIUM: 3.6 mmol/L (ref 3.5–5.1)
Sodium: 135 mmol/L (ref 135–145)

## 2018-03-18 LAB — HIV ANTIBODY (ROUTINE TESTING W REFLEX): HIV SCREEN 4TH GENERATION: NONREACTIVE

## 2018-03-18 MED ORDER — PREMIER PROTEIN SHAKE
11.0000 [oz_av] | Freq: Two times a day (BID) | ORAL | Status: DC
  Administered 2018-03-18 – 2018-03-19 (×2): 11 [oz_av] via ORAL

## 2018-03-18 MED ORDER — ADULT MULTIVITAMIN W/MINERALS CH
1.0000 | ORAL_TABLET | Freq: Every day | ORAL | Status: DC
  Administered 2018-03-19: 1 via ORAL
  Filled 2018-03-18: qty 1

## 2018-03-18 NOTE — Progress Notes (Signed)
Right upper lobe cavitary pneumonia. - Continue empiric abx per ID recs.  --Patient not candidate for bronchoscopy given MI.  --Followup outpatient in 2-3 weeks. Will need repeat imaging in 4-6 weeks to ensure improvement.   CAD --s/p NSTEMI with complete occlusion of LAD s/p DES 03/17/18.  Nicotine abuse. -Smoking cessation encouraged.   Wells Guiles, M.D., F.C.C.P.  Board Certified in Internal Medicine, Pulmonary Medicine, Critical Care Medicine, and Sleep Medicine.  [facility] Office Number: [phone]

## 2018-03-18 NOTE — Progress Notes (Signed)
[employer] - [place] at [facility]      PATIENT NAME: Jartavious Mckimmy    MR#:  [id number]  DATE OF BIRTH:  Jan 22, 1966  SUBJECTIVE:   Status post cardiac catheterization with drug-eluting stent to the LAD yesterday.  Denies any chest pain.  No hemoptysis and no other acute events overnight.  Awaiting AFBs x3.  REVIEW OF SYSTEMS:    Review of Systems  Constitutional: Negative for chills and fever.  HENT: Negative for congestion and tinnitus.   Eyes: Negative for blurred vision and double vision.  Respiratory: Negative for cough, shortness of breath and wheezing.   Cardiovascular: Negative for chest pain, orthopnea and PND.  Gastrointestinal: Negative for abdominal pain, diarrhea, nausea and vomiting.  Genitourinary: Negative for dysuria and hematuria.  Neurological: Negative for dizziness, sensory change and focal weakness.  All other systems reviewed and are negative.   Nutrition: Heart healthy Tolerating Diet: Yes Tolerating PT:  Ambulatory   DRUG ALLERGIES:  No Known Allergies  VITALS:  Blood pressure 101/79, pulse 87, temperature 98.2 F (36.8 C), temperature source Oral, resp. rate 18, height 5\' 6"  (1.676 m), weight 64.3 kg, SpO2 98 %.  PHYSICAL EXAMINATION:   Physical Exam  GENERAL:  52 y.o.-year-old patient lying in bed in no acute distress.  EYES: Pupils equal, round, reactive to light and accommodation. No scleral icterus. Extraocular muscles intact.  HEENT: Head atraumatic, normocephalic. Oropharynx and nasopharynx clear.  NECK:  Supple, no jugular venous distention. No thyroid enlargement, no tenderness.  LUNGS: Normal breath sounds bilaterally, no wheezing, rales, Few Rhonchi in R upper lung fields area. No use of accessory muscles of respiration.  CARDIOVASCULAR: S1, S2 normal. No murmurs, rubs, or gallops.  ABDOMEN: Soft, nontender, nondistended. Bowel sounds present. No organomegaly or mass.  EXTREMITIES: No cyanosis, clubbing or  edema b/l.    NEUROLOGIC: Cranial nerves II through XII are intact. No focal Motor or sensory deficits b/l.   PSYCHIATRIC: The patient is alert and oriented x 3.  SKIN: No obvious rash, lesion, or ulcer.    LABORATORY PANEL:   CBC Recent Labs  Lab 03/18/18 0506  WBC 7.1  HGB 11.8*  HCT 35.0*  PLT 449*   ------------------------------------------------------------------------------------------------------------------  Chemistries  Recent Labs  Lab 03/16/18 1433 03/18/18 0506  NA  --  135  K  --  3.6  CL  --  104  CO2  --  25  GLUCOSE  --  104*  BUN  --  <5*  CREATININE  --  0.66  CALCIUM  --  8.3*  MG 1.9  --   AST 36  --   ALT 11  --   ALKPHOS 54  --   BILITOT 1.0  --    ------------------------------------------------------------------------------------------------------------------  Cardiac Enzymes Recent Labs  Lab 03/16/18 1959  TROPONINI 3.82*   ------------------------------------------------------------------------------------------------------------------  RADIOLOGY:  No results found.   ASSESSMENT AND PLAN:   52 year old male with past medical history of asthma, ongoing tobacco abuse who presents to the hospital due to chest pain.  1.  Right upper lobe cavitary pneumonia- patient presented to the hospital with chest pain underwent CT of the chest which was suggestive of a right upper lobe cavitary pneumonia.  Patient has no previous history of tuberculosis. -Continue airborne precautions for now await QuantiFERON testing,  AFB smears have been ordered and are still pending.  -Appreciate infectious disease input.  This is likely necrotizing cavitary pneumonia and unlikely tuberculosis. - Continue ceftriaxone, Zithromax, Flagyl.   -  appreciate Pulm. Input and they have signed off. Follow up repeat imaging in 3-6 months.   2.  Non-ST elevation MI-patient has ruled in by cardiac markers.  He presented with chest pain and his troponins have trended  upwards. -Seen by cardiology, echocardiogram done which showed diffuse hypokinesis with ejection fraction of 35%. -Status post cardiac catheterization yesterday and noted to have significant one-vessel CAD with status post drug-eluting stent to the mid LAD.  Continue aspirin, Plavix, atorvastatin.  Blood pressure on the low side therefore off beta-blockers and ACE inhibitors.   All the records are reviewed and case discussed with Care Management/Social Worker. Management plans discussed with the patient, family and they are in agreement.  CODE STATUS: Full code  DVT Prophylaxis: Heparin gtt  TOTAL TIME TAKING CARE OF THIS PATIENT: 30 minutes.   POSSIBLE D/C IN 1-2 DAYS, DEPENDING ON CLINICAL CONDITION.   Houston Siren M.D on 03/18/2018 at 2:29 PM  Between 7am to 6pm - Pager - [phone]  After 6pm go to [email or web address] - Scientist, research (life sciences) [place] [facility]  Office  [phone]  CC: Primary care physician; [name], NP

## 2018-03-18 NOTE — Progress Notes (Signed)
Second AFB smear sent and awaiting results,order to collect the third is in system,patient remains in isolation,iv antibiotics continues

## 2018-03-18 NOTE — Plan of Care (Signed)
  Problem: Education: Goal: Understanding of CV disease, CV risk reduction, and recovery process will improve Outcome: Progressing   Problem: Activity: Goal: Ability to return to baseline activity level will improve Outcome: Progressing   Problem: Cardiovascular: Goal: Ability to achieve and maintain adequate cardiovascular perfusion will improve Outcome: Progressing Goal: Vascular access site(s) Level 0-1 will be maintained Outcome: Progressing   

## 2018-03-18 NOTE — Progress Notes (Signed)
Keith Warner is a 52 y.o. male smoker, h/o heavy alcohol use is admitted with burning sensation in the chest which did not subside with tums and he vomited and then called EMS. He was given NG with relief of pain . In the ED CXR revealed a rt upper lobe cavity and CT chest confirmed it. He was diagnosed with NSTEMI and on heparin drip. 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 He wore a respirator while working. He developed cough, chills, fatigue and night sweats. He went to his PCP and was given steroids and inhalers which made him feel better for a few days. 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 He is a smoker ( 1 PPD) he used to drink heavily until a year ago when he quit completely. He does not specifically remember choking on any food  Underwent cardiac cath and found to have LAD 99% lesion- stented by Dr.Arida: Wants to go home Very upset that TB results are not back Wanting to leave Objective:  VITALS:  BP 101/79 (BP Location: Right Arm)   Pulse 87   Temp 98.2 F (36.8 C) (Oral)   Resp 18   Ht 5\' 6"  (1.676 m)   Wt 64.3 kg   SpO2 98%   BMI 22.88 kg/m  PHYSICAL EXAM:  General: Alert, cooperative, no distress, appears stated age.  Lungs: crepts rt side Heart: Regular rate and rhythm, no murmur, rub or gallop. Abdomen: Soft, non-tender,not distended. Bowel sounds normal. No masses Extremities: atraumatic, no cyanosis. No edema. No clubbing Skin: No rashes or lesions. Or bruising Lymph: Cervical, supraclavicular normal. Neurologic: Grossly non-focal Pertinent Labs Lab Results CBC    Component Value Date/Time   WBC 7.1 03/18/2018 0506   RBC 3.85 (L) 03/18/2018 0506   HGB 11.8 (L) 03/18/2018 0506   HCT 35.0 (L) 03/18/2018 0506   PLT 449 (H) 03/18/2018 0506   MCV 90.9 03/18/2018 0506     MCH 30.6 03/18/2018 0506   MCHC 33.7 03/18/2018 0506   RDW 13.2 03/18/2018 0506   LYMPHSABS 1.1 03/18/2018 0506   MONOABS 0.7 03/18/2018 0506   EOSABS 0.1 03/18/2018 0506   BASOSABS 0.0 03/18/2018 0506    CMP Latest Ref Rng & Units 03/18/2018 03/16/2018 03/15/2018  Glucose 70 - 99 mg/dL 696(E) 952(W) 413(K)  BUN 6 - 20 mg/dL <4(M) 8 10  Creatinine 0.61 - 1.24 mg/dL 0.10 2.72 5.36  Sodium 135 - 145 mmol/L 135 138 137  Potassium 3.5 - 5.1 mmol/L 3.6 3.8 3.5  Chloride 98 - 111 mmol/L 104 102 99  CO2 22 - 32 mmol/L 25 26 28   Calcium 8.9 - 10.3 mg/dL 8.3(L) 9.2 10.0  Total Protein 6.5 - 8.1 g/dL - 7.1 -  Total Bilirubin 0.3 - 1.2 mg/dL - 1.0 -  Alkaline Phos 38 - 126 U/L - 54 -  AST 15 - 41 U/L - 36 -  ALT 0 - 44 U/L - 11 -      Microbiology: Recent Results (from the past 240 hour(s))  Blood Culture (routine x 2)     Status: None (Preliminary result)   Collection Time: 03/15/18  9:28 PM  Result Value Ref Range Status   Specimen Description BLOOD LEFT ANTECUBITAL  Final   Special Requests  Final    BOTTLES DRAWN AEROBIC AND ANAEROBIC Blood Culture results may not be optimal due to an excessive volume of blood received in culture bottles   Culture   Final    NO GROWTH 3 DAYS Performed at [facility], [address] [place], [place] [address]    Report Status PENDING  Incomplete  Blood Culture (routine x 2)     Status: None (Preliminary result)   Collection Time: 03/16/18  2:52 AM  Result Value Ref Range Status   Specimen Description BLOOD BLOOD LEFT HAND  Final   Special Requests   Final    BOTTLES DRAWN AEROBIC AND ANAEROBIC Blood Culture adequate volume   Culture   Final    NO GROWTH 2 DAYS Performed at [facility], [address] [place], [place] [address]    Report Status PENDING  Incomplete  MRSA PCR Screening     Status: None   Collection Time: 03/16/18  3:10 AM  Result Value Ref Range Status   MRSA by PCR NEGATIVE NEGATIVE Final     Comment:        The GeneXpert MRSA Assay (FDA approved for NASAL specimens only), is one component of a comprehensive MRSA colonization surveillance program. It is not intended to diagnose MRSA infection nor to guide or monitor treatment for MRSA infections. Performed at [facility], [address] [place], [place] [address]   Expectorated sputum assessment w rflx to resp cult     Status: None   Collection Time: 03/17/18  5:03 AM  Result Value Ref Range Status   Specimen Description EXPECTORATED SPUTUM  Final   Special Requests NONE  Final   Sputum evaluation   Final    Sputum specimen not acceptable for testing.  Please recollect.   NOTIFIED MERICA KIMIES AT 2130 ON 03/17/2018 JJB Performed at [facility], [address] [place], [place] [address]    Report Status 03/17/2018 FINAL  Final  Expectorated sputum assessment w rflx to resp cult     Status: None   Collection Time: 03/17/18  2:10 PM  Result Value Ref Range Status   Specimen Description TRACHEAL ASPIRATE  Final   Special Requests NONE  Final   Sputum evaluation   Final    Sputum specimen not acceptable for testing.  Please recollect.   RESULT CALLED TO, READ BACK BY AND VERIFIED WITH: JENNIFER GRAVES 03/17/18 1457 KLW Performed at [facility], [address] [place], [place] [address]    Report Status 03/17/2018 FINAL  Final   IMAGING RESULTS:   ?   2d echo  Left ventricle:  The cavity size was severely dilated. Systolic function was moderately to severely reduced. The estimated ejection fraction was 35%. Diffuse hypokinesis  Impression/Recommendation 52 y.o. male smoker, h/o heavy alcohol use is admitted with burning sensation in the chest which did not subside with tums and he vomited and then called EMS. He was given NG with relief of pain . In the ED CXR revealed a rt upper lobe cavity and CT chest confirmed it. He was diagnosed with NSTEMI and on heparin drip. He is also  on airborne isolation and I am asked to see him for cavitary lesion? ? ?Rt upper lobe cavitary lesion- likely necrotizing cavitary pneumonia, r/o aspiration. Coincidental black mold exposure but doubt fungal pneumonia Less likely TB t 3 sputums for AFB ( every 8hours ) Currently on ceftriaxone and flagyl-and azithromycin- will DC azithromycin As patient is desperate  to go home called lab and they found out that the first afb smear is negative Rest pending As patient is desperate to leave and TB is unlikely he could go and wear a mask when around people until the other sputum smear back. I will call jim and let him know. He will need augmentin 875mg  PO BID for 3-4 weeks.  He can follow up with me as OP [phone] to make an appt    NSTEMI- s/p LAD stent on plavix/aspirin/atorvastatin  Smoker  ___________________________________________________ Discussed with patient and  His father and  Dr.Sainani Explained to him in great detail about the reason for 3 sputums and the precautions he should take until all three are back ( even if the first one is negative)

## 2018-03-18 NOTE — Progress Notes (Signed)
Cardiovascular and Pulmonary Nurse Navigator Note:  52 year old male with long history of smoking, HLD, and asthma who presented to the hospital with acute chest pain, burning in his upper chest and cough over 3 - 4 weeks.  Patient found to have a cavitary lesion in chest and is currently on airborne isolation to rule / out TB.  Patient ruled in for NSTEMI.  Patient underwent cardiac catheterization yesterday with revealed 99% stenosis  of proximal LAD.  PCI with DES to proximal LAD.    Rounded on patient.  Patient's father at bedside.  The patient gave verbal permission for this RN to speak in front of his father about his medical information.    "Heart Attack Bouncing Back" booklet given and reviewed with patient. Discussed the definition of CAD. Reviewed the location of CAD and where his stent was placed. Informed patient he will be given a stent card. Explained the purpose of the stent card. Instructed patient to keep stent card in his wallet.  ? Discussed modifiable risk factors including controlling blood pressure, cholesterol, and blood sugar; following heart healthy diet; maintaining healthy weight; exercise; and smoking cessation.  ? Discussed cardiac medications including rationale for taking, mechanisms of action, and side effects. Stressed the importance of taking medications as prescribed.  ? Discussed emergency plan for heart attack symptoms. Patient verbalized understanding of need to call 911 and not to drive himself to ER if having cardiac symptoms / chest pain.  ? Heart healthy diet of low sodium, low fat, low cholesterol heart healthy diet discussed. Information on diet provided. Note: Dietitian Consultation has been entered for diet education.   ? Smoking Cessation - Patient is a current every day smoker. Patient reports smoking  1 - 1 1/2 pack per day. Patient informed this RN that he is done with smoking.  Patient stated he has not craved a cigarette since being in the hospital.   This RN alerted patient that he may have cravings when he returns to his home and his former routine. Instructed patient to think about what he is going to do instead a smoke when / if he has cravings.    "Thinking about Quitting - Yes You Can!" informational sheet reviewed with patient.  ? Exercise - Benefits of exercised discussed. Patient informed this RN that he is active on his job.  THis RN acknowledged that a non-sedentary life style is good, but exercise means what we do outside of work. Patient stated he is going to start exercising.   Informed patient that his cardiologist has referred him to outpatient Cardiac Rehab. An overview of the program was provided. Patient currently has no medical insurance coverage and stated he would not be able to pay out-of-pocket.  In addition, patient stated he would not be able to participate in this program due to his work schedule.  Patient does not want to receive a call from the outpatient [facility] Cardiac Rehab Dept about Cardiac Rehab.  We did discuss the AHA guidelines for moderate exercise of 150 minutes per week.  Patient plans to exercise at home.   ? Patient appreciative of the information.  ? Army Melia, RN, BSN, [facility]  [place]  [facility] Cardiac & Pulmonary Rehab  Cardiovascular & Pulmonary Nurse Navigator  Direct Line: [phone]  Department Phone #: [phone] Fax: [phone]  Email Address: [email or web address]

## 2018-03-18 NOTE — Progress Notes (Signed)
Progress Note  Patient Name: Keith Warner Date of Encounter: 03/18/2018  Primary Cardiologist: [name]  Subjective   Reports resolution of his chest pain In general feels better Continues to have productive cough  Inpatient Medications    Scheduled Meds: . aspirin EC  81 mg Oral Daily  . atorvastatin  80 mg Oral q1800  . clopidogrel  75 mg Oral Q breakfast  . [START ON 03/19/2018] multivitamin with minerals  1 tablet Oral Daily  . protein supplement shake  11 oz Oral BID BM  . sodium chloride flush  3 mL Intravenous Q12H   Continuous Infusions: . sodium chloride 250 mL (03/18/18 0404)  . azithromycin 500 mg (03/17/18 2252)  . cefTRIAXone (ROCEPHIN)  IV 2 g (03/17/18 2200)  . metronidazole 500 mg (03/18/18 1410)   PRN Meds: sodium chloride, acetaminophen **OR** acetaminophen, albuterol, alum & mag hydroxide-simeth, nitroGLYCERIN, ondansetron **OR** ondansetron (ZOFRAN) IV, sodium chloride flush   Vital Signs    Vitals:   03/17/18 2000 03/17/18 2100 03/18/18 0602 03/18/18 0810  BP: (!) 119/94 (!) 122/93 91/66 101/79  Pulse: 92 95 86 87  Resp: (!) 30 (!) 26 18   Temp:   98 F (36.7 C) 98.2 F (36.8 C)  TempSrc:   Oral Oral  SpO2: 96% 96% 99% 98%  Weight:   64.3 kg   Height:        Intake/Output Summary (Last 24 hours) at 03/18/2018 1629 Last data filed at 03/18/2018 0810 Gross per 24 hour  Intake -  Output 750 ml  Net -750 ml   Filed Weights   03/16/18 0212 03/17/18 0317 03/18/18 0602  Weight: 61.3 kg 63.4 kg 64.3 kg    Telemetry    Normal sinus rhythm- Personally Reviewed  ECG    - Personally Reviewed  Physical Exam   Constitutional:  oriented to person, place, and time. No distress.  HENT:  Head: Grossly normal Eyes:  no discharge. No scleral icterus.  Neck: No JVD, no carotid bruits  Cardiovascular: Regular rate and rhythm, no murmurs appreciated Pulmonary/Chest: Coarse breath sounds bilaterally Abdominal: Soft.  no  distension.  no tenderness.  Musculoskeletal: Normal range of motion Neurological:  normal muscle tone. Coordination normal. No atrophy Skin: Skin warm and dry Psychiatric: normal affect, pleasant   Labs    Chemistry Recent Labs  Lab 03/15/18 1705 03/16/18 0224 03/16/18 1433 03/18/18 0506  NA 137 138  --  135  K 3.5 3.8  --  3.6  CL 99 102  --  104  CO2 28 26  --  25  GLUCOSE 143* 113*  --  104*  BUN 10 8  --  <5*  CREATININE 0.85 0.65  --  0.66  CALCIUM 10.0 9.2  --  8.3*  PROT  --   --  7.1  --   ALBUMIN  --   --  3.0*  --   AST  --   --  36  --   ALT  --   --  11  --   ALKPHOS  --   --  54  --   BILITOT  --   --  1.0  --   GFRNONAA >60 >60  --  >60  GFRAA >60 >60  --  >60  ANIONGAP 10 10  --  6     Hematology Recent Labs  Lab 03/16/18 0224 03/17/18 0650 03/18/18 0506  WBC 10.5 6.6 7.1  RBC 4.05* 3.90* 3.85*  HGB  12.6* 12.1* 11.8*  HCT 37.6* 35.9* 35.0*  MCV 92.8 92.1 90.9  MCH 31.1 31.0 30.6  MCHC 33.5 33.7 33.7  RDW 13.2 13.4 13.2  PLT 504* 469* 449*    Cardiac Enzymes Recent Labs  Lab 03/16/18 0224 03/16/18 0935 03/16/18 1433 03/16/18 1959  TROPONINI 5.66* 6.21* 4.61* 3.82*   No results for input(s): TROPIPOC in the last 168 hours.   BNPNo results for input(s): BNP, PROBNP in the last 168 hours.   DDimer No results for input(s): DDIMER in the last 168 hours.   Radiology    No results found.  Cardiac Studies   Echocardiogram Cardiomyopathy, dilated, global hypokinesis with severe high hypokinesis anterior wall   Patient Profile     52 year old gentleman with long tree of smoking, hyperlipidemia, asthma, presenting to the hospital with acute onset chest pain, burning in his upper chest, coughing over the past 3 to 4 weeks. 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 Previously treated with prednisone may have improved his symptoms, overall not going away  Assessment & Plan    Cavitary lesion Long history of smoking, cultures pending On broad-spectrum antibiotics, isolation Seen by pulmonary  Non-STEMI Echocardiogram very concerning for anterior MI Troponin of 6 in the setting of unstable angina symptoms Cardiac catheterization yesterday noting 95 to 99% proximal LAD lesion, stent placed Today reports feeling better Continue aspirin, Plavix, statin,  Unable to add beta-blocker or ACE inhibitor at this time given low blood pressure x2 Once blood pressure improves, could add low-dose beta-blocker Stressed importance of medication compliance aspirin Plavix statin  Tobacco abuse Cessation recommended Does not need a nicotine patch Does not feel the urge to smoke    Total encounter time more than 25 minutes  Greater than 50% was spent in counseling and coordination of care with the patient   For questions or updates, please contact [facility] Please consult [email or web address] for contact info under        Signed, Julien Nordmann, MD  03/18/2018, 4:29 PM

## 2018-03-18 NOTE — Progress Notes (Signed)
Nutrition Follow Up Note   DOCUMENTATION CODES:   Not applicable  INTERVENTION:  Premier Protein BID, each supplement provides 160 kcal and 30 grams of protein.   MVI daily  NUTRITION DIAGNOSIS:   Increased nutrient needs related to acute illness as evidenced by estimated needs.  GOAL:   Patient will meet greater than or equal to 90% of their needs  -progressing   MONITOR:   PO intake, Supplement acceptance, Labs, Weight trends, Skin, I & O's  ASSESSMENT:  52yr old pt presented to ED yesterday experiencing 10/10 chest pain. Pt attempted to relieve the "burning" sensation by using Tums and reported vomiting. Pt reports coughing w/ white/yellow sputum, body aches, night sweats, and fatigue x 82mo. CT confirms presence of new rt upper lobe lesion. Current smoker w/ PMH of asthma.  RD received consult for heart healthy education. Pt not interested in heart healthy education today; reports he eats healthy at home. Pt reports currently eating 100% of meals but reports poor appetite and oral intake pta r/t respiratory illness. Pt reports he weighed 165lbs a few months ago; there is no weight history in chart to confirm any wt loss. Pt does not like supplements as they taste "chalky" but is willing to try chocolate Premeir Protein. RD stressed the importance of adequate protein intake needed to preserve lean muscle. RD will add MVI. RD provided patient with heart healthy education handouts to read at his leisure. RD will continue to follow this patient.   Medications reviewed and include: aspirin, plavix   Labs reviewed: BUN <5(L)  Diet Order:   Diet Order            Diet Heart Room service appropriate? Yes; Fluid consistency: Thin  Diet effective now             EDUCATION NEEDS:   Education needs have been addressed  Skin:  Skin Assessment: Reviewed RN Assessment  Last BM:  11/4 per pt report  Height:   Ht Readings from Last 1 Encounters:  03/16/18 5\' 6"  (1.676 m)     Weight:   Wt Readings from Last 1 Encounters:  03/18/18 64.3 kg    Ideal Body Weight:  63.8 kg  BMI:  Body mass index is 22.88 kg/m.  Estimated Nutritional Needs:   Kcal:  1900-2200kcal/day   Protein:  84-96g/day   Fluid:  >1.9L/day   Betsey Holiday MS, RD, LDN Pager #- [phone] Office#- [phone] After Hours Pager: [phone]

## 2018-03-19 LAB — CBC WITH DIFFERENTIAL/PLATELET
Abs Immature Granulocytes: 0.03 10*3/uL (ref 0.00–0.07)
BASOS PCT: 0 %
Basophils Absolute: 0 10*3/uL (ref 0.0–0.1)
EOS PCT: 2 %
Eosinophils Absolute: 0.1 10*3/uL (ref 0.0–0.5)
HCT: 34.3 % — ABNORMAL LOW (ref 39.0–52.0)
HEMOGLOBIN: 11.2 g/dL — AB (ref 13.0–17.0)
Immature Granulocytes: 1 %
Lymphocytes Relative: 22 %
Lymphs Abs: 1.3 10*3/uL (ref 0.7–4.0)
MCH: 31.2 pg (ref 26.0–34.0)
MCHC: 32.7 g/dL (ref 30.0–36.0)
MCV: 95.5 fL (ref 80.0–100.0)
MONO ABS: 1 10*3/uL (ref 0.1–1.0)
Monocytes Relative: 16 %
Neutro Abs: 3.5 10*3/uL (ref 1.7–7.7)
Neutrophils Relative %: 59 %
Platelets: 429 10*3/uL — ABNORMAL HIGH (ref 150–400)
RBC: 3.59 MIL/uL — AB (ref 4.22–5.81)
RDW: 13.3 % (ref 11.5–15.5)
WBC: 6 10*3/uL (ref 4.0–10.5)
nRBC: 0 % (ref 0.0–0.2)

## 2018-03-19 LAB — QUANTIFERON-TB GOLD PLUS: QUANTIFERON-TB GOLD PLUS: NEGATIVE

## 2018-03-19 LAB — QUANTIFERON-TB GOLD PLUS (RQFGPL)
QUANTIFERON NIL VALUE: 0.04 [IU]/mL
QUANTIFERON TB2 AG VALUE: 0.04 [IU]/mL
QuantiFERON TB1 Ag Value: 0.05 IU/mL

## 2018-03-19 LAB — ACID FAST SMEAR (AFB)

## 2018-03-19 LAB — ACID FAST SMEAR (AFB, MYCOBACTERIA): Acid Fast Smear: NEGATIVE

## 2018-03-19 MED ORDER — AMOXICILLIN-POT CLAVULANATE 875-125 MG PO TABS: 1.0000 | ORAL_TABLET | Freq: Two times a day (BID) | ORAL | 0 refills | Status: DC

## 2018-03-19 MED ORDER — ATORVASTATIN CALCIUM 80 MG PO TABS: 80.0000 mg | ORAL_TABLET | Freq: Every day | ORAL | 0 refills | Status: DC

## 2018-03-19 MED ORDER — AMOXICILLIN-POT CLAVULANATE 875-125 MG PO TABS
1.0000 | ORAL_TABLET | Freq: Two times a day (BID) | ORAL | Status: DC
  Administered 2018-03-19: 1 via ORAL
  Filled 2018-03-19: qty 1

## 2018-03-19 MED ORDER — NITROGLYCERIN 0.4 MG SL SUBL: 0.4000 mg | SUBLINGUAL_TABLET | SUBLINGUAL | 0 refills | Status: DC | PRN

## 2018-03-19 MED ORDER — ASPIRIN 81 MG PO TBEC: 81.0000 mg | DELAYED_RELEASE_TABLET | Freq: Every day | ORAL | 0 refills | Status: AC

## 2018-03-19 MED ORDER — ALBUTEROL SULFATE HFA 108 (90 BASE) MCG/ACT IN AERS: 1.0000 | INHALATION_SPRAY | Freq: Four times a day (QID) | RESPIRATORY_TRACT | 0 refills | Status: AC | PRN

## 2018-03-19 MED ORDER — PREMIER PROTEIN SHAKE: 11.0000 [oz_av] | Freq: Two times a day (BID) | ORAL | 0 refills | Status: DC

## 2018-03-19 MED ORDER — CLOPIDOGREL BISULFATE 75 MG PO TABS: 75.0000 mg | ORAL_TABLET | Freq: Every day | ORAL | 0 refills | Status: DC

## 2018-03-19 NOTE — Care Management Note (Signed)
Case Management Note  Patient Details  Name: HIROYUKI DINUZZO MRN: [id number] Date of Birth: December 04, 1965  Subjective/Objective:   Given MATCH card as well as [facility]/ medication management info. Patient tells me he is applying for medicaid and will use indigent services for PCP. Goodrx coupons also given in case MATCH is ineffective.                  Action/Plan:   Expected Discharge Date:  03/19/18               Expected Discharge Plan:     In-House Referral:     Discharge planning Services  CM Consult, Medication Assistance, MATCH Program, Indigent Health Clinic  Post Acute Care Choice:    Choice offered to:     DME Arranged:    DME Agency:     HH Arranged:    HH Agency:     Status of Service:  Completed, signed off  If discussed at [employer], dates discussed:    Additional Comments:  Virgel Manifold, RN 03/19/2018, 10:24 AM

## 2018-03-19 NOTE — Discharge Summary (Signed)
[employer] - [place] [facility]   PATIENT NAME: Keith Warner    MR#:  [id number]  DATE OF BIRTH:  12-14-1965  DATE OF ADMISSION:  03/15/2018 ADMITTING PHYSICIAN: Oralia Manis, MD  DATE OF DISCHARGE: 03/19/2018 11:25 AM  PRIMARY CARE PHYSICIAN: [name], NP    ADMISSION DIAGNOSIS:  Cavitary lesion of lung [J98.4] Elevated troponin I level [R79.89] Chest pain, unspecified type [R07.9]  DISCHARGE DIAGNOSIS:  Principal Problem:   Chest pain Active Problems:   Cavitary lesion of lung   Tobacco abuse   Asthma   Non-ST elevation (NSTEMI) myocardial infarction [facility])   SECONDARY DIAGNOSIS:   Past Medical History:  Diagnosis Date  . Asthma     HOSPITAL COURSE:   1.  Right upper lobe cavitary pneumonia.  The patient was started on aggressive antibiotics.  The patient was placed in isolation for tuberculosis rule out.  As per infectious disease.  One AFB is negative and the other 2 are still pending.  They will follow-up the AFBs as outpatient.  Patient also had a bronchoscopy and tracheal aspirate is still pending (along with fungal cultures).  Sent home on a total of 4 weeks worth of Augmentin.  Patient will need follow-up with infectious disease to follow-up cultures.  Patient will need follow-up with pulmonology as outpatient for a repeat CT scan. 2.  Non-ST elevation myocardial infarction.  The patient had a stent in the mid LAD.  The patient was placed on aspirin Plavix and atorvastatin.  Blood pressure was too low for ACE inhibitor or beta-blocker at this time.  Follow-up with cardiac rehab and with cardiology as outpatient. 3.  History of asthma.  Prescribed albuterol inhaler 4.  Malnutrition placed on protein drink by dietary  DISCHARGE CONDITIONS:   Satisfactory  CONSULTS OBTAINED:  Treatment Team:  Yvonne Kendall, MD Lamar Blinks, MD Lynn Ito, MD  DRUG ALLERGIES:  No Known Allergies  DISCHARGE MEDICATIONS:    Allergies as of 03/19/2018   No Known Allergies     Medication List    STOP taking these medications   bacitracin ointment     TAKE these medications   albuterol 108 (90 Base) MCG/ACT inhaler Commonly known as:  PROVENTIL HFA;VENTOLIN HFA Inhale 1 puff into the lungs every 6 (six) hours as needed for wheezing.   amoxicillin-clavulanate 875-125 MG tablet Commonly known as:  AUGMENTIN Take 1 tablet by mouth every 12 (twelve) hours.   aspirin 81 MG EC tablet Take 1 tablet (81 mg total) by mouth daily.   atorvastatin 80 MG tablet Commonly known as:  LIPITOR Take 1 tablet (80 mg total) by mouth daily at 6 PM.   clopidogrel 75 MG tablet Commonly known as:  PLAVIX Take 1 tablet (75 mg total) by mouth daily with breakfast.   nitroGLYCERIN 0.4 MG SL tablet Commonly known as:  NITROSTAT Place 1 tablet (0.4 mg total) under the tongue every 5 (five) minutes as needed for chest pain.   protein supplement shake Liqd Commonly known as:  PREMIER PROTEIN Take 325 mLs (11 oz total) by mouth 2 (two) times daily between meals.        DISCHARGE INSTRUCTIONS:   Follow-up PMD 5 days Follow-up infectious disease 2 weeks Follow-up pulmonary 4 weeks Follow-up cardiology 1 week  If you experience worsening of your admission symptoms, develop shortness of breath, life threatening emergency, suicidal or homicidal thoughts you must seek medical attention immediately by calling 911 or calling your MD immediately  if  symptoms less severe.  You Must read complete instructions/literature along with all the possible adverse reactions/side effects for all the Medicines you take and that have been prescribed to you. Take any new Medicines after you have completely understood and accept all the possible adverse reactions/side effects.   Please note  You were cared for by a hospitalist during your hospital stay. If you have any questions about your discharge medications or the care you received  while you were in the hospital after you are discharged, you can call the unit and asked to speak with the hospitalist on call if the hospitalist that took care of you is not available. Once you are discharged, your primary care physician will handle any further medical issues. Please note that NO REFILLS for any discharge medications will be authorized once you are discharged, as it is imperative that you return to your primary care physician (or establish a relationship with a primary care physician if you do not have one) for your aftercare needs so that they can reassess your need for medications and monitor your lab values.    Today   CHIEF COMPLAINT:   Chief Complaint  Patient presents with  . Chest Pain    HISTORY OF PRESENT ILLNESS:  Keith Warner  is a 52 y.o. male presented with chest pain and found to have a cavitary pneumonia   VITAL SIGNS:  Blood pressure 103/75, pulse 75, temperature 98.7 F (37.1 C), temperature source Oral, resp. rate 19, height 5\' 6"  (1.676 m), weight 62.8 kg, SpO2 98 %.   PHYSICAL EXAMINATION:  GENERAL:  52 y.o.-year-old patient lying in the bed with no acute distress.  EYES: Pupils equal, round, reactive to light and accommodation. No scleral icterus. Extraocular muscles intact.  HEENT: Head atraumatic, normocephalic. Oropharynx and nasopharynx clear.  NECK:  Supple, no jugular venous distention. No thyroid enlargement, no tenderness.  LUNGS: Normal breath sounds bilaterally, no wheezing, rales,rhonchi or crepitation. No use of accessory muscles of respiration.  CARDIOVASCULAR: S1, S2 normal. No murmurs, rubs, or gallops.  ABDOMEN: Soft, non-tender, non-distended. Bowel sounds present. No organomegaly or mass.  EXTREMITIES: No pedal edema, cyanosis, or clubbing.  NEUROLOGIC: Cranial nerves II through XII are intact. Muscle strength 5/5 in all extremities. Sensation intact. Gait not checked.  PSYCHIATRIC: The patient is alert and oriented x 3.   SKIN: No obvious rash, lesion, or ulcer.   DATA REVIEW:   CBC Recent Labs  Lab 03/19/18 0420  WBC 6.0  HGB 11.2*  HCT 34.3*  PLT 429*    Chemistries  Recent Labs  Lab 03/16/18 1433 03/18/18 0506  NA  --  135  K  --  3.6  CL  --  104  CO2  --  25  GLUCOSE  --  104*  BUN  --  <5*  CREATININE  --  0.66  CALCIUM  --  8.3*  MG 1.9  --   AST 36  --   ALT 11  --   ALKPHOS 54  --   BILITOT 1.0  --     Cardiac Enzymes Recent Labs  Lab 03/16/18 1959  TROPONINI 3.82*    Microbiology Results  Results for orders placed or performed during the hospital encounter of 03/15/18  Blood Culture (routine x 2)     Status: None (Preliminary result)   Collection Time: 03/15/18  9:28 PM  Result Value Ref Range Status   Specimen Description BLOOD LEFT ANTECUBITAL  Final   Special Requests  Final    BOTTLES DRAWN AEROBIC AND ANAEROBIC Blood Culture results may not be optimal due to an excessive volume of blood received in culture bottles   Culture   Final    NO GROWTH 4 DAYS Performed at [facility], [address] [place], [place] [address]    Report Status PENDING  Incomplete  Blood Culture (routine x 2)     Status: None (Preliminary result)   Collection Time: 03/16/18  2:52 AM  Result Value Ref Range Status   Specimen Description BLOOD BLOOD LEFT HAND  Final   Special Requests   Final    BOTTLES DRAWN AEROBIC AND ANAEROBIC Blood Culture adequate volume   Culture   Final    NO GROWTH 3 DAYS Performed at [facility], [address], [place], [place] [address]    Report Status PENDING  Incomplete  MRSA PCR Screening     Status: None   Collection Time: 03/16/18  3:10 AM  Result Value Ref Range Status   MRSA by PCR NEGATIVE NEGATIVE Final    Comment:        The GeneXpert MRSA Assay (FDA approved for NASAL specimens only), is one component of a comprehensive MRSA colonization surveillance program. It is not intended to diagnose MRSA infection  nor to guide or monitor treatment for MRSA infections. Performed at [facility], [address] [place], [place] [address]   Acid Fast Smear (AFB)     Status: None   Collection Time: 03/16/18  3:37 PM  Result Value Ref Range Status   AFB Specimen Processing Concentration  Final   Acid Fast Smear Negative  Final    Comment: (NOTE) Performed At: [facility] [address] [place], [place] [id number] Jolene Schimke MD OZ:[id number]    Source (AFB) SPUTUM  Final    Comment: Performed at [facility], [address] [place], [place] [address]  Expectorated sputum assessment w rflx to resp cult     Status: None   Collection Time: 03/17/18  5:03 AM  Result Value Ref Range Status   Specimen Description EXPECTORATED SPUTUM  Final   Special Requests NONE  Final   Sputum evaluation   Final    Sputum specimen not acceptable for testing.  Please recollect.   NOTIFIED MERICA KIMIES AT 8413 ON 03/17/2018 JJB Performed at [facility], [address] [place], [place] [address]    Report Status 03/17/2018 FINAL  Final  Expectorated sputum assessment w rflx to resp cult     Status: None   Collection Time: 03/17/18  2:10 PM  Result Value Ref Range Status   Specimen Description TRACHEAL ASPIRATE  Final   Special Requests NONE  Final   Sputum evaluation   Final    Sputum specimen not acceptable for testing.  Please recollect.   RESULT CALLED TO, READ BACK BY AND VERIFIED WITH: JENNIFER GRAVES 03/17/18 1457 KLW Performed at [facility], [address] [place], [place] [address]    Report Status 03/17/2018 FINAL  Final      Management plans discussed with the patient, and he is in agreement.  CODE STATUS:     Code Status Orders  (From admission, onward)         Start     Ordered   03/16/18 0209  Full code  Continuous     03/16/18 0208        Code Status History    This patient has a current  code status but no historical code  status.      TOTAL TIME TAKING CARE OF THIS PATIENT: 35 minutes.    Alford Highland M.D on 03/19/2018 at 2:42 PM  Between 7am to 6pm - Pager - [phone]  After 6pm go to [email or web address] - password [email or web address]  [employer] Office  [phone]  CC: Primary care physician; [name], NP

## 2018-03-20 LAB — CULTURE, BLOOD (ROUTINE X 2): Culture: NO GROWTH

## 2018-03-21 ENCOUNTER — Telehealth: Payer: Self-pay | Admitting: Licensed Clinical Social Worker

## 2018-03-21 ENCOUNTER — Telehealth: Payer: Self-pay

## 2018-03-21 LAB — CULTURE, BLOOD (ROUTINE X 2)
Culture: NO GROWTH
SPECIAL REQUESTS: ADEQUATE

## 2018-03-21 LAB — ACID FAST SMEAR (AFB)

## 2018-03-21 LAB — ACID FAST SMEAR (AFB, MYCOBACTERIA): Acid Fast Smear: NEGATIVE

## 2018-03-21 NOTE — Telephone Encounter (Signed)
Patient called stating he was "just following up" since being discharged from the hospital over the weekend. He states he is feeling much better and he will call when he is close to ending antibiotics or if symptoms return.

## 2018-03-21 NOTE — Telephone Encounter (Signed)
l mom for pt to call back to schedule hosp f/u 2-4 weeks

## 2018-03-21 NOTE — Telephone Encounter (Signed)
-----   Message from Janean Sark, [place] sent at 03/21/2018  8:36 AM EST ----- Pt needs hfu with me in 2-4 weeks for pneumonia, copd.

## 2018-03-21 NOTE — Telephone Encounter (Signed)
Patient scheduled for 04/12/18  ° °

## 2018-03-25 ENCOUNTER — Telehealth: Payer: Self-pay | Admitting: Licensed Clinical Social Worker

## 2018-03-25 NOTE — Telephone Encounter (Signed)
Called patient and left message that AFB sputum culture was negative and to see how he was feeling on the Augmentin.

## 2018-04-11 ENCOUNTER — Other Ambulatory Visit: Payer: Self-pay

## 2018-04-11 ENCOUNTER — Telehealth: Payer: Self-pay

## 2018-04-11 DIAGNOSIS — J181 Lobar pneumonia, unspecified organism: Secondary | ICD-10-CM

## 2018-04-11 NOTE — Telephone Encounter (Signed)
LM with mother for patient to get CXR before 04/12/18 apt.

## 2018-04-12 ENCOUNTER — Encounter: Payer: Self-pay | Admitting: Pulmonary Disease

## 2018-04-12 ENCOUNTER — Ambulatory Visit (INDEPENDENT_AMBULATORY_CARE_PROVIDER_SITE_OTHER): Payer: Self-pay | Admitting: Pulmonary Disease

## 2018-04-12 ENCOUNTER — Ambulatory Visit
Admission: RE | Admit: 2018-04-12 | Discharge: 2018-04-12 | Disposition: A | Discharge status: Home / Self Care | Payer: Self-pay | Source: Ambulatory Visit | Attending: Pulmonary Disease | Admitting: Pulmonary Disease

## 2018-04-12 ENCOUNTER — Encounter: Payer: Self-pay | Admitting: Nurse Practitioner

## 2018-04-12 ENCOUNTER — Ambulatory Visit (INDEPENDENT_AMBULATORY_CARE_PROVIDER_SITE_OTHER): Payer: Self-pay | Admitting: Nurse Practitioner

## 2018-04-12 VITALS — BP 126/78 | HR 78 | Resp 16 | Ht 66.0 in | Wt 150.0 lb

## 2018-04-12 VITALS — BP 130/80 | HR 64 | Ht 67.0 in | Wt 150.0 lb

## 2018-04-12 DIAGNOSIS — F17200 Nicotine dependence, unspecified, uncomplicated: Secondary | ICD-10-CM

## 2018-04-12 DIAGNOSIS — J984 Other disorders of lung: Secondary | ICD-10-CM

## 2018-04-12 DIAGNOSIS — J189 Pneumonia, unspecified organism: Secondary | ICD-10-CM

## 2018-04-12 DIAGNOSIS — I255 Ischemic cardiomyopathy: Secondary | ICD-10-CM

## 2018-04-12 DIAGNOSIS — I2510 Atherosclerotic heart disease of native coronary artery without angina pectoris: Secondary | ICD-10-CM

## 2018-04-12 DIAGNOSIS — J181 Lobar pneumonia, unspecified organism: Secondary | ICD-10-CM

## 2018-04-12 DIAGNOSIS — I5022 Chronic systolic (congestive) heart failure: Secondary | ICD-10-CM

## 2018-04-12 DIAGNOSIS — E785 Hyperlipidemia, unspecified: Secondary | ICD-10-CM

## 2018-04-12 DIAGNOSIS — Z720 Tobacco use: Secondary | ICD-10-CM

## 2018-04-12 DIAGNOSIS — I214 Non-ST elevation (NSTEMI) myocardial infarction: Secondary | ICD-10-CM

## 2018-04-12 MED ORDER — LOSARTAN POTASSIUM 25 MG PO TABS: 25.0000 mg | ORAL_TABLET | Freq: Every day | ORAL | 3 refills | Status: DC

## 2018-04-12 MED ORDER — ATORVASTATIN CALCIUM 80 MG PO TABS: 80.0000 mg | ORAL_TABLET | Freq: Every day | ORAL | 3 refills | Status: DC

## 2018-04-12 MED ORDER — CLOPIDOGREL BISULFATE 75 MG PO TABS: 75.0000 mg | ORAL_TABLET | Freq: Every day | ORAL | 3 refills | Status: DC

## 2018-04-12 NOTE — Progress Notes (Signed)
Office Visit    Patient Name: Keith Warner Date of Encounter: 04/12/2018  Primary Care Provider:  [name], NP Primary Cardiologist:  Julien Nordmann, MD  Chief Complaint    52 year old male with a history of tobacco abuse and asthma, who was recently admitted in the setting of non-STEMI and cavitary pneumonia, who presents for post hospital follow-up.  Past Medical History    Past Medical History:  Diagnosis Date  . Asthma   . CAD (coronary artery disease)    a. 03/2018 NSTEMI/PCI: LM nl, LAD >95p (3.0x26 Resolute Onyx DES), LCX nl, RCA dominant, nl. EF<20%.  . Cavitary pneumonia    a. 03/2018 s/p prolonged abx course.  Marland Kitchen HFrEF (heart failure with reduced ejection fraction) (HCC)    a. 03/2018 Echo: EF 35%.  . Hyperlipidemia LDL goal <70   . Ischemic cardiomyopathy    a. 03/2018 Echo: EF 35%, diff HK, Gr1DD, mild AI/MR. Mod dil RV.  . Tobacco abuse    Past Surgical History:  Procedure Laterality Date  . CORONARY STENT INTERVENTION N/A 03/17/2018   Procedure: CORONARY STENT INTERVENTION;  Surgeon: Iran Ouch, MD;  Location: [facility] INVASIVE CV LAB;  Service: Cardiovascular;  Laterality: N/A;  . gangrene    . LEFT HEART CATH AND CORONARY ANGIOGRAPHY N/A 03/17/2018   Procedure: LEFT HEART CATH AND CORONARY ANGIOGRAPHY poss PCI;  Surgeon: Antonieta Iba, MD;  Location: [facility] INVASIVE CV LAB;  Service: Cardiovascular;  Laterality: N/A;    Allergies  No Known Allergies  History of Present Illness    52 year old male with the above past medical history including tobacco abuse and asthma.  Over a 3 to 4-week period in October and November, he reported productive cough with yellow sputum, body aches, night sweats, and fatigue.  In that setting, he has significant weight loss and anorexia.  He presented to  regional in early November with acute onset of chest pain.  Chest CT showed right upper lobe cavitary pneumonia and aortic/coronary atherosclerosis.   Troponin was also elevated and subsequently peaked at 6.21.  He was treated with intravenous antibiotics for cavitary pneumonia and seen by our team and underwent echocardiogram which showed an EF of 35%.  In the setting of new cardiomyopathy, he underwent diagnostic catheterization November 7, revealing severe proximal LAD disease and this was successfully treated with a drug-eluting stent.  Post procedure course was uncomplicated he was subsequently discharged.  Since his discharge, he says he has done remarkably well.  He was just seen by pulmonology this morning and taken off of antibiotics.  He has not had any chest pain or dyspnea and says he is working on quitting smoking.  He denies PND, orthopnea, palpitations, dizziness, syncope, edema, or early satiety.  He does not plan to participate in cardiac rehabilitation secondary to lack of insurance.  Home Medications    Prior to Admission medications   Medication Sig Start Date End Date Taking? Authorizing Provider  albuterol (PROVENTIL HFA;VENTOLIN HFA) 108 (90 Base) MCG/ACT inhaler Inhale 1 puff into the lungs every 6 (six) hours as needed for wheezing. 03/19/18  Yes Wieting, Dempsy, MD  aspirin EC 81 MG EC tablet Take 1 tablet (81 mg total) by mouth daily. 03/19/18  Yes Alford Highland, MD  atorvastatin (LIPITOR) 80 MG tablet Take 1 tablet (80 mg total) by mouth daily at 6 PM. 03/19/18  Yes Wieting, Gustavo, MD  clopidogrel (PLAVIX) 75 MG tablet Take 1 tablet (75 mg total) by mouth daily  with breakfast. 03/19/18  Yes Wieting, Kiwan, MD  nitroGLYCERIN (NITROSTAT) 0.4 MG SL tablet Place 1 tablet (0.4 mg total) under the tongue every 5 (five) minutes as needed for chest pain. 03/19/18  Yes Wieting, Ersel, MD    Review of Systems    He denies chest pain, palpitations, dyspnea, pnd, orthopnea, n, v, dizziness, syncope, edema, weight gain, or early satiety.  All other systems reviewed and are otherwise negative except as noted above.  Physical  Exam    VS:  BP 130/80 (BP Location: Left Arm, Patient Position: Sitting, Cuff Size: Normal)   Pulse 64   Ht 5\' 7"  (1.702 m)   Wt 150 lb (68 kg)   BMI 23.49 kg/m  , BMI Body mass index is 23.49 kg/m. GEN: Well nourished, well developed, in no acute distress. HEENT: normal. Neck: Supple, no JVD, carotid bruits, or masses. Cardiac: RRR, no murmurs, rubs, or gallops. No clubbing, cyanosis, edema.  Radials/PT 1+ and equal bilaterally.  Respiratory:  Respirations regular and unlabored, clear to auscultation bilaterally. GI: Soft, nontender, nondistended, BS + x 4. MS: no deformity or atrophy. Skin: warm and dry, no rash. Neuro:  Strength and sensation are intact. Psych: Normal affect.  Accessory Clinical Findings    ECG personally reviewed by me today -regular sinus rhythm, 64, left atrial enlargement, septal infarct with deep anterolateral T wave inversion- no acute changes.  Assessment & Plan    1.  Non-STEMI, subsequent episode of care/CAD: Status post hospitalization in November with chest pain following a 4-week history of progressive malaise in the setting of right upper lobe cavitary pneumonia.  Echo showed an EF of 35%.  Diagnostic catheterization showed severe proximal LAD disease and this was excessively treated with a drug-eluting stent.  He has been maintained on aspirin, statin, and Plavix therapy.  They were not able to initiate beta-blocker or ARB during admission secondary to soft blood pressures.  Blood pressure is better today and I am going to add losartan 25 mg daily.  He has been doing well without any chest pain or dyspnea and is looking forward to returning to work.  Unfortunately, he does not have insurance and does not plan to participate in cardiac rehabilitation.  2.  Ischemic cardiomyopathy/HFrEF: EF 35% by echo during admission in November.  Euvolemic on exam. We discussed the importance of daily weights, sodium restriction, medication compliance, and symptom  reporting and he verbalizes understanding.  As above, they were not able to add beta-blocker or ARB during admission secondary to soft blood pressures.  Blood pressure 130/80 today.  I am adding losartan 25 mg daily.  Follow-up basic metabolic panel in 1 week.  3.  Hyperlipidemia: LDL was 106 on November 6.  LFTs were okay.  Continue high potency statin therapy and follow-up lipids and LFTs in 6 weeks.  4.  Tobacco abuse: Patient says he has quit.  I stressed the importance of tobacco cessation and offered medication assistance if necessary.  5.  Right upper lobe cavitary pneumonia: Seen by pulmonology earlier today and antibiotics discontinued.  6.  Disposition: Follow-up basic metabolic panel in 1 week.  Follow-up in clinic in 4 to 6 weeks with plan for follow-up lipids and LFTs at that time.   Nicolasa Ducking, NP 04/12/2018, 3:28 PM

## 2018-04-12 NOTE — Patient Instructions (Signed)
Medication Instructions:  Your physician has recommended you make the following change in your medication:  1- START Losartan 25 mg (1 tablet) by mouth once a day.  If you need a refill on your cardiac medications before your next appointment, please call your pharmacy.   Lab work: 1- Your physician recommends that you return for lab work in: 1 week at [facility] ([facility]). - Please go to the [facility]. You will check in at the front desk to the right as you walk into the atrium. Valet Parking is offered if needed.    2- Your physician recommends that you return for lab work in: 6 weeks to check cholesterol and liver function (LIPID, LIVER). - Please go to the [facility]. You will check in at the front desk to the right as you walk into the atrium. Valet Parking is offered if needed. - You will need to be FASTING. - On or around 05/24/2018.  If you have labs (blood work) drawn today and your tests are completely normal, you will receive your results only by: Marland Kitchen MyChart Message (if you have MyChart) OR . A paper copy in the mail If you have any lab test that is abnormal or we need to change your treatment, we will call you to review the results.  Testing/Procedures: none  Follow-Up: At [facility], you and your health needs are our priority.  As part of our continuing mission to provide you with exceptional heart care, we have created designated Provider Care Teams.  These Care Teams include your primary Cardiologist (physician) and Advanced Practice Providers (APPs -  Physician Assistants and Nurse Practitioners) who all work together to provide you with the care you need, when you need it. You will need a follow up appointment in 6-8 weeks.  You may see Julien Nordmann, MD (only).

## 2018-04-12 NOTE — Patient Instructions (Signed)
Your chest x-ray has improved very nicely and you are now left with a small amount of scarring in the upper part of your right lung  You may stop all further antibiotics  We discussed smoking cessation in detail  If you are unable to quit smoking on your own, contact us for another appointment to discuss the possibility of Chantix therapy  Follow-up as needed

## 2018-04-12 NOTE — Progress Notes (Signed)
PULMONARY HOSPITAL FOLLOW UP NOTE  Requesting MD/Service: [facility] Hospitalist Service Date of initial consultation:  Reason for consultation:   PT PROFILE: 52 y.o. male smoker hospitalized 11/05-11/09/19 with RL cavitary PNA STEMI. Underwent PTCA and DES to LAD 03/17/18. Follow up for PNA and smoking  DATA: 03/15/18 CTA chest: Findings are favored to reflect cavitary pneumonia in the RUL. The possibility of a cavitary neoplasm is not excluded (but not favored at this time). No evidence of pulmonary embolism  INTERVAL: He has improved nearly back to baseline since discharge  HPI:  His recent hospitalization has been reviewed in detail.  His initial presentation was likely due to non-STEMI.  However, he had also noted cough with purulent sputum for several days prior to admission.  His chest x-ray demonstrated right upper lobe cavitary infiltrate thought to represent pneumonia.  He had multiple AFB smears which were negative.  He was discharged home on Augmentin.  He has been taking this now for 3 weeks and has nearly 1 week of antibiotics left.  He has no new complaints.  His respiratory status is back to baseline.  He has no further chest pain.  He has no purulent sputum or hemoptysis.  He quit smoking for 2-1/2 weeks after discharge but has relapsed and is presently smoking approximately 2-3 cigarettes/day.  Past Medical History:  Diagnosis Date  . AMI (acute myocardial infarction) (HCC) 03/17/2018   S/P PTCA and DES to LAD 03/17/18  . Asthma     Past Surgical History:  Procedure Laterality Date  . CORONARY STENT INTERVENTION N/A 03/17/2018   Procedure: CORONARY STENT INTERVENTION;  Surgeon: Iran Ouch, MD;  Location: [facility] INVASIVE CV LAB;  Service: Cardiovascular;  Laterality: N/A;  . gangrene    . LEFT HEART CATH AND CORONARY ANGIOGRAPHY N/A 03/17/2018   Procedure: LEFT HEART CATH AND CORONARY ANGIOGRAPHY poss PCI;  Surgeon: Antonieta Iba, MD;  Location: [facility] INVASIVE CV LAB;   Service: Cardiovascular;  Laterality: N/A;    MEDICATIONS: I have reviewed all medications and confirmed regimen as documented  Social History   Socioeconomic History  . Marital status: Single    Spouse name: Not on file  . Number of children: Not on file  . Years of education: Not on file  . Highest education level: Not on file  Occupational History  . Not on file  Social Needs  . Financial resource strain: Not on file  . Food insecurity:    Worry: Not on file    Inability: Not on file  . Transportation needs:    Medical: Not on file    Non-medical: Not on file  Tobacco Use  . Smoking status: Current Every Day Smoker    Packs/day: 1.00    Years: 35.00    Pack years: 35.00    Types: Cigarettes  . Smokeless tobacco: Never Used  Substance and Sexual Activity  . Alcohol use: Yes  . Drug use: Never  . Sexual activity: Not on file  Lifestyle  . Physical activity:    Days per week: Not on file    Minutes per session: Not on file  . Stress: Not on file  Relationships  . Social connections:    Talks on phone: Not on file    Gets together: Not on file    Attends religious service: Not on file    Active member of club or organization: Not on file    Attends meetings of clubs or organizations: Not on file  Relationship status: Not on file  . Intimate partner violence:    Fear of current or ex partner: Not on file    Emotionally abused: Not on file    Physically abused: Not on file    Forced sexual activity: Not on file  Other Topics Concern  . Not on file  Social History Narrative  . Not on file    Family History  Problem Relation Age of Onset  . CAD Mother     ROS: No fever, myalgias/arthralgias, unexplained weight loss or weight gain No new focal weakness or sensory deficits No otalgia, hearing loss, visual changes, nasal and sinus symptoms, mouth and throat problems No neck pain or adenopathy No abdominal pain, N/V/D, diarrhea, change in bowel pattern No  dysuria, change in urinary pattern   Vitals:   04/12/18 1331 04/12/18 1339  BP:  126/78  Pulse:  78  Resp: 16   SpO2:  100%  Weight: 150 lb (68 kg)   Height: 5\' 6"  (1.676 m)      EXAM:  Gen: WDWN, No overt respiratory distress HEENT: NCAT, sclera white, oropharynx normal Neck: Supple without LAN, thyromegaly, JVD Lungs: breath sounds full, percussion normal, adventitious sounds: None Cardiovascular: RRR, no murmurs noted Abdomen: Soft, nontender, normal BS Ext: without clubbing, cyanosis, edema Neuro: CNs grossly intact, motor and sensory intact Skin: Limited exam, no lesions noted  DATA:   BMP Latest Ref Rng & Units 03/18/2018 03/16/2018 03/15/2018  Glucose 70 - 99 mg/dL 235(T) 732(K) 025(K)  BUN 6 - 20 mg/dL <2(H) 8 10  Creatinine 0.61 - 1.24 mg/dL 0.62 3.76 2.83  Sodium 135 - 145 mmol/L 135 138 137  Potassium 3.5 - 5.1 mmol/L 3.6 3.8 3.5  Chloride 98 - 111 mmol/L 104 102 99  CO2 22 - 32 mmol/L 25 26 28   Calcium 8.9 - 10.3 mg/dL 8.3(L) 9.2 10.0    CBC Latest Ref Rng & Units 03/19/2018 03/18/2018 03/17/2018  WBC 4.0 - 10.5 K/uL 6.0 7.1 6.6  Hemoglobin 13.0 - 17.0 g/dL 11.2(L) 11.8(L) 12.1(L)  Hematocrit 39.0 - 52.0 % 34.3(L) 35.0(L) 35.9(L)  Platelets 150 - 400 K/uL 429(H) 449(H) 469(H)    CXR 12/03: Inflammatory changes in the right upper lobe are resolved.  He has a thin-walled cavity which persists but has shrunk slightly since previous chest x-ray  I have personally reviewed all chest radiographs reported above including CXRs and CT chest unless otherwise indicated  IMPRESSION:     ICD-10-CM   1. Smoker F17.200   2. RUL cavitary pneumonia - resolving J18.9    J98.4   3. Apical lung scarring after RUL PNA J98.4    The marked improvement in chest x-ray findings eliminate the essential concerns for tuberculosis or malignancy.  Given the dramatic improvement, I do not think that he needs any further imaging such as repeat CT scan of the chest.  He is likely to be  left with a small amount of scarring in the right upper lobe as a result of this recent pneumonia.  No further evaluation or therapies are indicated at this time   PLAN:  1) we reviewed chest x-rays together in the office today 2) I recommended that he stop all further antibiotics 3) we discussed smoking cessation in detail (15 minutes) and I emphasized the contribution of smoking to his premature coronary artery disease. He wishes to make an effort on his own.  We discussed the possibility of Chantix therapy.  He is to contact our office  if he is unable to quit on his own and wishes to pursue Chantix.  Follow-up as needed   Billy Fischer, MD PCCM service Mobile [phone] Pager [phone] 04/12/2018 2:08 PM

## 2018-05-01 LAB — ACID FAST CULTURE WITH REFLEXED SENSITIVITIES: ACID FAST CULTURE - AFSCU3: NEGATIVE

## 2018-05-03 LAB — ACID FAST CULTURE WITH REFLEXED SENSITIVITIES (MYCOBACTERIA): Acid Fast Culture: NEGATIVE

## 2018-05-03 LAB — ACID FAST CULTURE WITH REFLEXED SENSITIVITIES

## 2018-05-24 LAB — ACID FAST SMEAR (AFB, MYCOBACTERIA)

## 2018-05-24 LAB — ACID FAST SMEAR (AFB): ACID FAST SMEAR - AFSCU2: NEGATIVE

## 2018-05-25 LAB — ACID FAST CULTURE WITH REFLEXED SENSITIVITIES (MYCOBACTERIA)

## 2018-05-25 LAB — ACID FAST CULTURE WITH REFLEXED SENSITIVITIES

## 2018-06-12 NOTE — Progress Notes (Deleted)
No SHOW

## 2018-06-14 ENCOUNTER — Ambulatory Visit: Payer: Self-pay | Admitting: Cardiovascular Disease

## 2018-06-15 ENCOUNTER — Encounter: Payer: Self-pay | Admitting: Cardiovascular Disease

## 2018-07-07 ENCOUNTER — Telehealth: Payer: Self-pay | Admitting: *Deleted

## 2018-07-07 LAB — ACID FAST SMEAR (AFB)

## 2018-07-07 LAB — ACID FAST SMEAR (AFB, MYCOBACTERIA): Acid Fast Smear: NEGATIVE

## 2018-07-07 LAB — AFB ORGANISM ID BY DNA PROBE
M AVIUM COMPLEX: NEGATIVE
M GORDONAE: NEGATIVE
M KANSASII: NEGATIVE
M tuberculosis complex: NEGATIVE

## 2018-07-07 LAB — ORG ID BY SEQUENCING RFLX AST

## 2018-07-07 LAB — ACID FAST CULTURE WITH REFLEXED SENSITIVITIES: ACID FAST CULTURE - AFSCU3: POSITIVE — AB

## 2018-07-07 NOTE — Telephone Encounter (Signed)
Patient was due for lab work in mid-January and not been done at this time. No answer. Left message without using patient identifiers to go to [facility] at his earliest convenience, to be fasting and to call back if he has any further questions.

## 2018-12-08 NOTE — Progress Notes (Signed)
Cardiology Office Note    Date:  12/09/2018   ID:  Keith Warner, DOB 1966/02/28, MRN [id number]  PCP:  [name], NP  Cardiologist:  Julien Nordmann, MD  Electrophysiologist:  None   Chief Complaint: Follow up  History of Present Illness:   Keith Warner is a 53 y.o. male with history of CAD with NSTEMI in 03/2018, HFrEF secondary to ICM, cavitary PNA, HLD, asthma, and tobacco abuse who presents for follow up of his CAD and cardiomyopathy.   He was admitted in 03/2018 with a cavitary PNA in the right upper lobe. CT chest also noted coronary artery calcifications and aortic atherosclerosis. Troponin peaked at 6.21. Echo showed an EF of 35%, diffuse hypokinesis, grade 1 diastolic dysfunction, mild aortic insufficiency and mitral regurgitation, moderately dilated right ventricle.  In the setting of acute systolic CHF and elevated troponin, he underwent cardiac cath that showed severe proximal LAD disease which was successfully treated with PCI/DES. Remaining cath details as below. He was not able to be started on evidence-based medical therapy in the setting of relative hypotension. He was last seen in the office in 04/2018 and was doing well. Weight was 150 pounds. He was started on losartan 25 mg daily at that time. Follow up labs were recommended, though not completed.   He comes in doing very well since he was last seen.  No chest pain, shortness of breath, palpitations, dizziness, presyncope, or syncope.  No lower extremity swelling, abdominal distention, orthopnea, PND, or early satiety.  No falls, BRBPR, or melena.  For a brief timeframe since his last visit he did self discontinue losartan though he noted his blood pressure was running higher with this so has subsequently resumed this medication with improvement in blood pressure readings at home.  He reports compliance with dual antiplatelet therapy and denies missing any doses.  He is compliant with atorvastatin.  He has  not needed any sublingual nitro.  He is eating a much healthier diet consisting mostly of salad, chicken, and [place].  He is not eating out at restaurants.  He is not adding salt to food.  He does continue to smoke though at 1 to 2 cigarettes/day when at this time last year he was smoking 2 packs/day.  He does not have any issues or concerns at this time.  Labs: 03/2018 - WBC 6.0, HGB 11.2, PLT 429, K+ 3.6, SCr 0.66, A1c 5.9, magnesium 1.9, TC 160, TG 93, HDL 35, LDL 106, albumin 3.0, AST/ALT normal   Past Medical History:  Diagnosis Date   Asthma    CAD (coronary artery disease)    a. 03/2018 NSTEMI/PCI: LM nl, LAD >95p (3.0x26 Resolute Onyx DES), LCX nl, RCA dominant, nl. EF<20%.   Cavitary pneumonia    a. 03/2018 s/p prolonged abx course.   HFrEF (heart failure with reduced ejection fraction) (HCC)    a. 03/2018 Echo: EF 35%.   Hyperlipidemia LDL goal <70    Ischemic cardiomyopathy    a. 03/2018 Echo: EF 35%, diff HK, Gr1DD, mild AI/MR. Mod dil RV.   Tobacco abuse     Past Surgical History:  Procedure Laterality Date   CORONARY STENT INTERVENTION N/A 03/17/2018   Procedure: CORONARY STENT INTERVENTION;  Surgeon: Iran Ouch, MD;  Location: [facility] INVASIVE CV LAB;  Service: Cardiovascular;  Laterality: N/A;   gangrene     LEFT HEART CATH AND CORONARY ANGIOGRAPHY N/A 03/17/2018   Procedure: LEFT HEART CATH AND CORONARY ANGIOGRAPHY poss PCI;  Surgeon: Antonieta IbaGollan, Timothy J, MD;  Location: [facility] INVASIVE CV LAB;  Service: Cardiovascular;  Laterality: N/A;    Current Medications: Current Meds  Medication Sig   albuterol (PROVENTIL HFA;VENTOLIN HFA) 108 (90 Base) MCG/ACT inhaler Inhale 1 puff into the lungs every 6 (six) hours as needed for wheezing.   aspirin EC 81 MG EC tablet Take 1 tablet (81 mg total) by mouth daily.   atorvastatin (LIPITOR) 80 MG tablet Take 1 tablet (80 mg total) by mouth daily at 6 PM.   clopidogrel (PLAVIX) 75 MG tablet Take 1 tablet (75 mg total)  by mouth daily with breakfast.   nitroGLYCERIN (NITROSTAT) 0.4 MG SL tablet Place 1 tablet (0.4 mg total) under the tongue every 5 (five) minutes as needed for chest pain.     Allergies:   Patient has no known allergies.   Social History   Socioeconomic History   Marital status: Single    Spouse name: Not on file   Number of children: Not on file   Years of education: Not on file   Highest education level: Not on file  Occupational History   Not on file  Social Needs   Financial resource strain: Not on file   Food insecurity    Worry: Not on file    Inability: Not on file   Transportation needs    Medical: Not on file    Non-medical: Not on file  Tobacco Use   Smoking status: Former Smoker    Packs/day: 1.00    Years: 35.00    Pack years: 35.00    Types: Cigarettes    Quit date: 03/13/2018    Years since quitting: 0.7   Smokeless tobacco: Never Used  Substance and Sexual Activity   Alcohol use: Yes   Drug use: Never   Sexual activity: Not on file  Lifestyle   Physical activity    Days per week: Not on file    Minutes per session: Not on file   Stress: Not on file  Relationships   Social connections    Talks on phone: Not on file    Gets together: Not on file    Attends religious service: Not on file    Active member of club or organization: Not on file    Attends meetings of clubs or organizations: Not on file    Relationship status: Not on file  Other Topics Concern   Not on file  Social History Narrative   Not on file     Family History:  The patient's family history includes CAD in his mother.  ROS:   Review of Systems  Constitutional: Negative for chills, diaphoresis, fever, malaise/fatigue and weight loss.  HENT: Negative for congestion.   Eyes: Negative for discharge and redness.  Respiratory: Negative for cough, hemoptysis, sputum production, shortness of breath and wheezing.   Cardiovascular: Negative for chest pain,  palpitations, orthopnea, claudication, leg swelling and PND.  Gastrointestinal: Negative for abdominal pain, blood in stool, heartburn, melena, nausea and vomiting.  Genitourinary: Negative for hematuria.  Musculoskeletal: Negative for falls and myalgias.  Skin: Negative for rash.  Neurological: Negative for dizziness, tingling, tremors, sensory change, speech change, focal weakness, loss of consciousness and weakness.  Endo/Heme/Allergies: Does not bruise/bleed easily.  Psychiatric/Behavioral: Negative for substance abuse. The patient is not nervous/anxious.   All other systems reviewed and are negative.    EKGs/Labs/Other Studies Reviewed:    Studies reviewed were summarized above. The additional studies were reviewed today:  2D  Echo 03/2018: - Left ventricle: The cavity size was severely dilated. Systolic   function was moderately to severely reduced. The estimated   ejection fraction was 35%. Diffuse hypokinesis. Doppler   parameters are consistent with abnormal left ventricular   relaxation (grade 1 diastolic dysfunction). - Aortic valve: There was mild regurgitation. - Mitral valve: There was mild regurgitation. - Right ventricle: The cavity size was moderately dilated.  Impressions:  - 4 chamber dialatation wit severe LV systolic dysfunction, mild   MR/TR. __________  LHC 03/2018: Coronary angiography:  Coronary dominance: Right or codominant  Left mainstem:   Large vessel that bifurcates into the LAD and left circumflex, no significant disease noted, short left main  Left anterior descending (LAD):   Large vessel that extends to the apical region, diagonal branch 2 of moderate size, critical proximal disease >95%  Left circumflex (LCx):  Large vessel with OM branch 2, no significant disease noted  Right coronary artery (RCA):  Right dominant vessel with PL and PDA, no significant disease noted  Left ventriculography: Left ventricular systolic function is  severely depressed, EF <20%  there is no significant mitral regurgitation , no significant aortic valve stenosis  Final Conclusions:   Critical single vessel disease >95% proximal LAD stenosis Severely depressed LV function, global, severe in anterior wall  Recommendations:  Case discussed with Dr. Kirke CorinArida,  He will attempt PCI of the LAD stenosis __________  PCI 03/2018:  Prox LAD lesion is 99% stenosed.  Post intervention, there is a 0% residual stenosis.  A drug-eluting stent was successfully placed using a STENT RESOLUTE ONYX 3.0X26.   Successful angioplasty and drug-eluting stent placement to the proximal LAD.  Recommendations:  Recommend uninterrupted dual antiplatelet therapy with Aspirin 81mg  daily and Clopidogrel 75mg  daily for a minimum of 12 months (ACS - Class I recommendation).  Aggressive treatment of risk factors. Start heart failure medications once blood pressure tolerates __________  EKG:  EKG is ordered today.  The EKG ordered today demonstrates NSR, 75 bpm, normal axis, no acute ST-T changes, baseline wandering along aVF, improved anterior lateral T wave changes  Recent Labs: 03/16/2018: ALT 11; Magnesium 1.9 03/18/2018: BUN <5; Creatinine, Ser 0.66; Potassium 3.6; Sodium 135 03/19/2018: Hemoglobin 11.2; Platelets 429  Recent Lipid Panel    Component Value Date/Time   CHOL 160 03/16/2018 0935   TRIG 93 03/16/2018 0935   HDL 35 (L) 03/16/2018 0935   CHOLHDL 4.6 03/16/2018 0935   VLDL 19 03/16/2018 0935   LDLCALC 106 (H) 03/16/2018 0935    PHYSICAL EXAM:    VS:  BP 120/82 (BP Location: Left Arm, Patient Position: Sitting, Cuff Size: Normal)    Pulse 75    Ht 5\' 6"  (1.676 m)    Wt 148 lb 8 oz (67.4 kg)    SpO2 98%    BMI 23.97 kg/m   BMI: Body mass index is 23.97 kg/m.  Physical Exam  Constitutional: He is oriented to person, place, and time. He appears well-developed and well-nourished.  HENT:  Head: Normocephalic and atraumatic.  Eyes: Right  eye exhibits no discharge. Left eye exhibits no discharge.  Neck: Normal range of motion. No JVD present.  Cardiovascular: Normal rate, regular rhythm, S1 normal, S2 normal and normal heart sounds. Exam reveals no distant heart sounds, no friction rub, no midsystolic click and no opening snap.  No murmur heard. Pulses:      Posterior tibial pulses are 2+ on the right side and 2+ on the left side.  Pulmonary/Chest: Effort normal and breath sounds normal. No respiratory distress. He has no decreased breath sounds. He has no wheezes. He has no rales. He exhibits no tenderness.  Abdominal: Soft. He exhibits no distension. There is no abdominal tenderness.  Musculoskeletal:        General: No edema.  Neurological: He is alert and oriented to person, place, and time.  Skin: Skin is warm and dry. No cyanosis. Nails show no clubbing.  Psychiatric: He has a normal mood and affect. His speech is normal and behavior is normal. Judgment and thought content normal.    Wt Readings from Last 3 Encounters:  12/09/18 148 lb 8 oz (67.4 kg)  04/12/18 150 lb (68 kg)  04/12/18 150 lb (68 kg)     ASSESSMENT & PLAN:   1. CAD involving the native coronary arteries without angina with non-STEMI and 03/2018: He is doing well without any symptoms concerning for angina.  Continue dual antiplatelet therapy, with aspirin and Plavix, without interruption through at least 03/2019.  Patient will follow-up with his primary cardiologist in 04/2019 for determining if the patient should remain on dual antiplatelet therapy or if he can be transitioned to mono antiplatelet therapy.  Check CBC.  Aggressive risk factor modification and secondary prevention including complete smoking cessation and optimization of lipids.  No plans for ischemic evaluation at this time.  2. HFrEF secondary to ICM: Initial echo from 03/2018 showed an EF of 35% during admission for cavitary pneumonia and non-STEMI status post PCI as outlined above.   During that admission, relative hypotension precluded addition of evidence-based heart failure therapy.  However, during follow-up in 04/2018 the patient was started on losartan 25 mg daily.  Patient did not follow-up with recommended BMP thereafter.  Patient is euvolemic and well compensated.  He has been compliant most recently with his losartan 25 mg daily.  We will obtain an echocardiogram to evaluate for improvement in his LV systolic function.  If his EF remains reduced at that time recommend addition of carvedilol or metoprolol succinate and close follow-up CHF education discussed.  3. Hyperlipidemia: LDL of 106 from 03/2018 with normal liver function at that time.  Follow-up lipid and liver function was recommended in 05/2018 though not completed.  Remains on atorvastatin 80 mg daily.  Check CMP and lipid panel.  4. Tobacco abuse: Unfortunately, the patient continues to smoke 1 to 2 cigarettes/day.  This is improved from 2 packs/day at this time last year.  He will continue to work on self tapering of tobacco abuse.  Complete cessation is recommended.  Addendum: After I had walked out of the patient's room the lab tech went into his room for venipuncture as discussed during our office visit.  The patient refused blood work citing concerns he may get COVID-19 from this.    Disposition: F/u with Dr. Rockey Situ or an APP in 4 months.   Medication Adjustments/Labs and Tests Ordered: Current medicines are reviewed at length with the patient today.  Concerns regarding medicines are outlined above. Medication changes, Labs and Tests ordered today are summarized above and listed in the Patient Instructions accessible in Encounters.   Signed, Keith Faith, PA-C 12/09/2018 8:59 AM     [address] [place], [place] [address] [phone]

## 2018-12-09 ENCOUNTER — Encounter: Payer: Self-pay | Admitting: Physician Assistant

## 2018-12-09 ENCOUNTER — Other Ambulatory Visit: Payer: Self-pay

## 2018-12-09 ENCOUNTER — Ambulatory Visit (INDEPENDENT_AMBULATORY_CARE_PROVIDER_SITE_OTHER): Payer: Self-pay | Admitting: Physician Assistant

## 2018-12-09 VITALS — BP 120/82 | HR 75 | Ht 66.0 in | Wt 148.5 lb

## 2018-12-09 DIAGNOSIS — I255 Ischemic cardiomyopathy: Secondary | ICD-10-CM

## 2018-12-09 DIAGNOSIS — I214 Non-ST elevation (NSTEMI) myocardial infarction: Secondary | ICD-10-CM

## 2018-12-09 DIAGNOSIS — I5022 Chronic systolic (congestive) heart failure: Secondary | ICD-10-CM

## 2018-12-09 DIAGNOSIS — I2510 Atherosclerotic heart disease of native coronary artery without angina pectoris: Secondary | ICD-10-CM

## 2018-12-09 DIAGNOSIS — E785 Hyperlipidemia, unspecified: Secondary | ICD-10-CM

## 2018-12-09 DIAGNOSIS — Z720 Tobacco use: Secondary | ICD-10-CM

## 2018-12-09 NOTE — Patient Instructions (Addendum)
Medication Instructions:  - Your physician recommends that you continue on your current medications as directed. Please refer to the Current Medication list given to you today.  If you need a refill on your cardiac medications before your next appointment, please call your pharmacy.   Lab work: - Your physician recommends that you have lab work today: CMET/ CBC/ Lipid (patient refused due to COVID-19)  If you have labs (blood work) drawn today and your tests are completely normal, you will receive your results only by: Marland Kitchen MyChart Message (if you have MyChart) OR . A paper copy in the mail If you have any lab test that is abnormal or we need to change your treatment, we will call you to review the results.  Testing/Procedures: - Your physician has requested that you have an echocardiogram. Echocardiography is a painless test that uses sound waves to create images of your heart. It provides your doctor with information about the size and shape of your heart and how well your heart's chambers and valves are working. This procedure takes approximately one hour. There are no restrictions for this procedure.  Follow-Up: At [facility], you and your health needs are our priority.  As part of our continuing mission to provide you with exceptional heart care, we have created designated Provider Care Teams.  These Care Teams include your primary Cardiologist (physician) and Advanced Practice Providers (APPs -  Physician Assistants and Nurse Practitioners) who all work together to provide you with the care you need, when you need it. You will need a follow up appointment in December 2020. Please call our office 2 months in advance to schedule this appointment. (Call in early October to schedule).  You may see Ida Rogue, MD or one of the following Advanced Practice Providers on your designated Care Team:   Murray Hodgkins, NP Christell Faith, PA-C . Marrianne Mood, PA-C  Any Other Special  Instructions Will Be Listed Below (If Applicable). - N/A

## 2018-12-13 ENCOUNTER — Other Ambulatory Visit: Payer: Self-pay

## 2019-01-04 ENCOUNTER — Ambulatory Visit: Payer: Self-pay | Admitting: Nurse Practitioner

## 2019-01-10 ENCOUNTER — Other Ambulatory Visit: Payer: Self-pay

## 2019-04-10 DIAGNOSIS — I2510 Atherosclerotic heart disease of native coronary artery without angina pectoris: Secondary | ICD-10-CM | POA: Insufficient documentation

## 2019-04-11 ENCOUNTER — Ambulatory Visit: Payer: Self-pay | Admitting: Cardiovascular Disease

## 2019-04-25 ENCOUNTER — Other Ambulatory Visit: Payer: Self-pay | Admitting: *Deleted

## 2019-04-25 MED ORDER — CLOPIDOGREL BISULFATE 75 MG PO TABS: 75.0000 mg | ORAL_TABLET | Freq: Every day | ORAL | 0 refills | Status: DC

## 2019-04-25 NOTE — Telephone Encounter (Signed)
Lmovm to verify if pt is taking Losartan 25 mg tablet qd not on pt's last ov medication list.

## 2019-04-26 MED ORDER — LOSARTAN POTASSIUM 25 MG PO TABS: 25.0000 mg | ORAL_TABLET | Freq: Every day | ORAL | 0 refills | Status: DC

## 2019-04-28 ENCOUNTER — Other Ambulatory Visit: Payer: Self-pay

## 2019-04-28 MED ORDER — ATORVASTATIN CALCIUM 80 MG PO TABS: 80.0000 mg | ORAL_TABLET | Freq: Every day | ORAL | 0 refills | Status: DC

## 2019-05-29 ENCOUNTER — Other Ambulatory Visit: Payer: Self-pay

## 2019-05-29 ENCOUNTER — Telehealth: Payer: Self-pay

## 2019-05-29 MED ORDER — ATORVASTATIN CALCIUM 80 MG PO TABS: 80.0000 mg | ORAL_TABLET | Freq: Every day | ORAL | 0 refills | Status: DC

## 2019-05-29 NOTE — Telephone Encounter (Signed)
-----   Message from Festus Aloe, CMA sent at 05/29/2019  2:00 PM EST ----- Please contact patient for a follow up appt to see Solon Palm for a refill on medication.  Thanks, Jasmine December

## 2019-05-29 NOTE — Telephone Encounter (Signed)
lmov to schedule.  Also needs echo.

## 2019-06-14 NOTE — Telephone Encounter (Signed)
Attempted to schedule.  LMOV to call office.  ° °

## 2019-06-23 NOTE — Telephone Encounter (Signed)
3 attempts to schedule fu appt from recall list.   Deleting recall.   

## 2019-06-27 ENCOUNTER — Telehealth: Payer: Self-pay | Admitting: Cardiovascular Disease

## 2019-06-27 NOTE — Telephone Encounter (Signed)
Virtual Visit Pre-Appointment Phone Call  "(Name), I am calling you today to discuss your upcoming appointment. We are currently trying to limit exposure to the virus that causes COVID-19 by seeing patients at home rather than in the office."  1. "What is the BEST phone number to call the day of the visit?" - include this in appointment notes  2. "Do you have or have access to (through a family member/friend) a smartphone with video capability that we can use for your visit?" a. If yes - list this number in appt notes as "cell" (if different from BEST phone #) and list the appointment type as a VIDEO visit in appointment notes b. If no - list the appointment type as a PHONE visit in appointment notes  3. Confirm consent - "In the setting of the current Covid19 crisis, you are scheduled for a (phone or video) visit with your provider on (date) at (time).  Just as we do with many in-office visits, in order for you to participate in this visit, we must obtain consent.  If you'd like, I can send this to your mychart (if signed up) or email for you to review.  Otherwise, I can obtain your verbal consent now.  All virtual visits are billed to your insurance company just like a normal visit would be.  By agreeing to a virtual visit, we'd like you to understand that the technology does not allow for your provider to perform an examination, and thus may limit your provider's ability to fully assess your condition. If your provider identifies any concerns that need to be evaluated in person, we will make arrangements to do so.  Finally, though the technology is pretty good, we cannot assure that it will always work on either your or our end, and in the setting of a video visit, we may have to convert it to a phone-only visit.  In either situation, we cannot ensure that we have a secure connection.  Are you willing to proceed?" STAFF: Did the patient verbally acknowledge consent to telehealth visit? Document  YES/NO here: YES  4. Advise patient to be prepared - "Two hours prior to your appointment, go ahead and check your blood pressure, pulse, oxygen saturation, and your weight (if you have the equipment to check those) and write them all down. When your visit starts, your provider will ask you for this information. If you have an Apple Watch or Kardia device, please plan to have heart rate information ready on the day of your appointment. Please have a pen and paper handy nearby the day of the visit as well."  5. Give patient instructions for MyChart download to smartphone OR Doximity/Doxy.me as below if video visit (depending on what platform provider is using)  6. Inform patient they will receive a phone call 15 minutes prior to their appointment time (may be from unknown caller ID) so they should be prepared to answer    TELEPHONE CALL NOTE  Keith Warner has been deemed a candidate for a follow-up tele-health visit to limit community exposure during the Covid-19 pandemic. I spoke with the patient via phone to ensure availability of phone/video source, confirm preferred email & phone number, and discuss instructions and expectations.  I reminded Keith Warner to be prepared with any vital sign and/or heart rhythm information that could potentially be obtained via home monitoring, at the time of his visit. I reminded Keith Warner to expect a phone call prior to  his visit.  Norman Herrlich 06/27/2019 9:40 AM   INSTRUCTIONS FOR DOWNLOADING THE MYCHART APP TO SMARTPHONE  - The patient must first make sure to have activated MyChart and know their login information - If Apple, go to [employer] and type in MyChart in the search bar and download the app. If Android, ask patient to go to [employer] and type in [place] in the search bar and download the app. The app is free but as with any other app downloads, their phone may require them to verify saved payment information or  Apple/Android password.  - The patient will need to then log into the app with their MyChart username and password, and select [facility] as their healthcare provider to link the account. When it is time for your visit, go to the MyChart app, find appointments, and click Begin Video Visit. Be sure to Select Allow for your device to access the Microphone and Camera for your visit. You will then be connected, and your provider will be with you shortly.  **If they have any issues connecting, or need assistance please contact MyChart service desk [phone])**  **If using a computer, in order to ensure the best quality for their visit they will need to use either of the following Internet Browsers: [employer] or Google Chrome**  IF USING DOXIMITY or DOXY.ME - The patient will receive a link just prior to their visit by text.     FULL LENGTH CONSENT FOR TELE-HEALTH VISIT   I hereby voluntarily request, consent and authorize [facility] and its employed or contracted physicians, physician assistants, nurse practitioners or other licensed health care professionals (the Practitioner), to provide me with telemedicine health care services (the "Services") as deemed necessary by the treating Practitioner. I acknowledge and consent to receive the Services by the Practitioner via telemedicine. I understand that the telemedicine visit will involve communicating with the Practitioner through live audiovisual communication technology and the disclosure of certain medical information by electronic transmission. I acknowledge that I have been given the opportunity to request an in-person assessment or other available alternative prior to the telemedicine visit and am voluntarily participating in the telemedicine visit.  I understand that I have the right to withhold or withdraw my consent to the use of telemedicine in the course of my care at any time, without affecting my right to future care  or treatment, and that the Practitioner or I may terminate the telemedicine visit at any time. I understand that I have the right to inspect all information obtained and/or recorded in the course of the telemedicine visit and may receive copies of available information for a reasonable fee.  I understand that some of the potential risks of receiving the Services via telemedicine include:  Marland Kitchen Delay or interruption in medical evaluation due to technological equipment failure or disruption; . Information transmitted may not be sufficient (e.g. poor resolution of images) to allow for appropriate medical decision making by the Practitioner; and/or  . In rare instances, security protocols could fail, causing a breach of personal health information.  Furthermore, I acknowledge that it is my responsibility to provide information about my medical history, conditions and care that is complete and accurate to the best of my ability. I acknowledge that Practitioner's advice, recommendations, and/or decision may be based on factors not within their control, such as incomplete or inaccurate data provided by me or distortions of diagnostic images or specimens that may result from electronic transmissions. I understand  that the practice of medicine is not an exact science and that Practitioner makes no warranties or guarantees regarding treatment outcomes. I acknowledge that I will receive a copy of this consent concurrently upon execution via email to the email address I last provided but may also request a printed copy by calling the office of [place].    I understand that my insurance will be billed for this visit.   I have read or had this consent read to me. . I understand the contents of this consent, which adequately explains the benefits and risks of the Services being provided via telemedicine.  . I have been provided ample opportunity to ask questions regarding this consent and the Services and have had  my questions answered to my satisfaction. . I give my informed consent for the services to be provided through the use of telemedicine in my medical care  By participating in this telemedicine visit I agree to the above.

## 2019-06-28 ENCOUNTER — Other Ambulatory Visit: Payer: Self-pay

## 2019-06-28 ENCOUNTER — Telehealth (INDEPENDENT_AMBULATORY_CARE_PROVIDER_SITE_OTHER): Payer: Self-pay | Admitting: Cardiovascular Disease

## 2019-06-28 ENCOUNTER — Encounter: Payer: Self-pay | Admitting: Cardiovascular Disease

## 2019-06-28 VITALS — BP 132/87 | HR 84 | Ht 66.0 in | Wt 150.0 lb

## 2019-06-28 DIAGNOSIS — I255 Ischemic cardiomyopathy: Secondary | ICD-10-CM

## 2019-06-28 DIAGNOSIS — Z720 Tobacco use: Secondary | ICD-10-CM

## 2019-06-28 DIAGNOSIS — J984 Other disorders of lung: Secondary | ICD-10-CM

## 2019-06-28 DIAGNOSIS — I25118 Atherosclerotic heart disease of native coronary artery with other forms of angina pectoris: Secondary | ICD-10-CM

## 2019-06-28 DIAGNOSIS — E785 Hyperlipidemia, unspecified: Secondary | ICD-10-CM

## 2019-06-28 MED ORDER — CLOPIDOGREL BISULFATE 75 MG PO TABS: 75.0000 mg | ORAL_TABLET | Freq: Every day | ORAL | 3 refills | Status: DC

## 2019-06-28 MED ORDER — LOSARTAN POTASSIUM 25 MG PO TABS: 25.0000 mg | ORAL_TABLET | Freq: Every day | ORAL | 3 refills | Status: DC

## 2019-06-28 MED ORDER — ROSUVASTATIN CALCIUM 20 MG PO TABS: 20.0000 mg | ORAL_TABLET | Freq: Every day | ORAL | 3 refills | Status: DC

## 2019-06-28 NOTE — Progress Notes (Signed)
Virtual Visit via Video Note   This visit type was conducted due to national recommendations for restrictions regarding the COVID-19 Pandemic (e.g. social distancing) in an effort to limit this patient's exposure and mitigate transmission in our community.  Due to his co-morbid illnesses, this patient is at least at moderate risk for complications without adequate follow up.  This format is felt to be most appropriate for this patient at this time.  All issues noted in this document were discussed and addressed.  A limited physical exam was performed with this format.  Please refer to the patient's chart for his consent to telehealth for [facility].   I connected with  Keith Warner on 06/28/19 by a video enabled telemedicine application and verified that I am speaking with the correct person using two identifiers. I discussed the limitations of evaluation and management by telemedicine. The patient expressed understanding and agreed to proceed.   Evaluation Performed:  Follow-up visit  Date:  06/28/2019   ID:  Keith Warner, DOB [birth year], MRN [id number]  Patient Location:  [address] [place] [address]   Provider location:   [facility], [place] office  PCP:  [name], NP  Cardiologist:  [name] [facility]  Chief Complaint  Patient presents with  . telehealth visit    Discuss side effects from atorvastatin; Meds verbally reviewed with patient.     History of Present Illness:    Keith Warner is a 54 y.o. male who presents via audio/video conferencing for a telehealth visit today.   The patient does not symptoms concerning for COVID-19 infection (fever, chills, cough, or new SHORTNESS OF BREATH).   54 year old male with a history of  tobacco abuse and asthma,  CAD, NSTEMI Severe proximal LAD disease Mr. Woodside LAD lesion noted , close to 99% 03/2018 Successfully stented by Dr. Kirke Corin cavitary pneumonia  October 2019   productive cough body aches, night sweats, and fatigue.weight loss and anorexia  Plain  in  November with  chest pain.  Chest CT showed right upper lobe cavi  Cavitary lesion Long history of smoking,   Non-STEMI Echocardiogram very concerning for anterior MI Troponin of 6 in the setting of unstable angina symptoms Cardiac catheterization yesterday noting 95 to 99% proximal LAD lesion, stent placed  Tobacco abuse Still smoking, 6 a day Cavity   "goes to work and comes home" Little exposure to groups,  BP high without pill    Prior CV studies:   The following studies were reviewed today:    Past Medical History:  Diagnosis Date  . Asthma   . CAD (coronary artery disease)    a. 03/2018 NSTEMI/PCI: LM nl, LAD >95p (3.0x26 Resolute Onyx DES), LCX nl, RCA dominant, nl. EF<20%.  . Cavitary pneumonia    a. 03/2018 s/p prolonged abx course.  Marland Kitchen HFrEF (heart failure with reduced ejection fraction) (HCC)    a. 03/2018 Echo: EF 35%.  . Hyperlipidemia LDL goal <70   . Ischemic cardiomyopathy    a. 03/2018 Echo: EF 35%, diff HK, Gr1DD, mild AI/MR. Mod dil RV.  . Tobacco abuse    Past Surgical History:  Procedure Laterality Date  . CORONARY STENT INTERVENTION N/A 03/17/2018   Procedure: CORONARY STENT INTERVENTION;  Surgeon: Iran Ouch, MD;  Location: [facility] INVASIVE CV LAB;  Service: Cardiovascular;  Laterality: N/A;  . gangrene    . LEFT HEART CATH AND CORONARY ANGIOGRAPHY N/A 03/17/2018  Procedure: LEFT HEART CATH AND CORONARY ANGIOGRAPHY poss PCI;  Surgeon: Antonieta Iba, MD;  Location: [facility] INVASIVE CV LAB;  Service: Cardiovascular;  Laterality: N/A;      Allergies:   Patient has no known allergies.   Social History   Tobacco Use  . Smoking status: Former Smoker    Packs/day: 0.25    Years: 35.00    Pack years: 8.75    Types: Cigarettes    Quit date: 03/13/2018    Years since quitting: 1.2  . Smokeless tobacco: Never Used  Substance Use Topics  .  Alcohol use: Yes  . Drug use: Never     Current Outpatient Medications on File Prior to Visit  Medication Sig Dispense Refill  . albuterol (PROVENTIL HFA;VENTOLIN HFA) 108 (90 Base) MCG/ACT inhaler Inhale 1 puff into the lungs every 6 (six) hours as needed for wheezing. 1 Inhaler 0  . aspirin EC 81 MG EC tablet Take 1 tablet (81 mg total) by mouth daily. 30 tablet 0  . atorvastatin (LIPITOR) 80 MG tablet Take 1 tablet (80 mg total) by mouth daily at 6 PM. (Patient taking differently: Take 40 mg by mouth daily at 6 PM. ) 30 tablet 0  . clopidogrel (PLAVIX) 75 MG tablet Take 1 tablet (75 mg total) by mouth daily with breakfast. 90 tablet 0  . losartan (COZAAR) 25 MG tablet Take 1 tablet (25 mg total) by mouth daily. 30 tablet 0  . nitroGLYCERIN (NITROSTAT) 0.4 MG SL tablet Place 1 tablet (0.4 mg total) under the tongue every 5 (five) minutes as needed for chest pain. 30 tablet 0   No current facility-administered medications on file prior to visit.     Family Hx: The patient's family history includes CAD in his mother.  ROS:   Please see the history of present illness.    Review of Systems  Constitutional: Negative.   HENT: Negative.   Respiratory: Negative.   Cardiovascular: Negative.   Gastrointestinal: Negative.   Musculoskeletal: Negative.   Neurological: Negative.   Psychiatric/Behavioral: Negative.   All other systems reviewed and are negative.    Labs/Other Tests and Data Reviewed:    Recent Labs: No results found for requested labs within last 8760 hours.   Recent Lipid Panel Lab Results  Component Value Date/Time   CHOL 160 03/16/2018 09:35 AM   TRIG 93 03/16/2018 09:35 AM   HDL 35 (L) 03/16/2018 09:35 AM   CHOLHDL 4.6 03/16/2018 09:35 AM   LDLCALC 106 (H) 03/16/2018 09:35 AM    Wt Readings from Last 3 Encounters:  06/28/19 150 lb (68 kg)  12/09/18 148 lb 8 oz (67.4 kg)  04/12/18 150 lb (68 kg)     Exam:    Vital Signs: Vital signs may also be detailed  in the HPI BP 132/87 (BP Location: Right Arm, Patient Position: Sitting)   Pulse 84   Ht 5\' 6"  (1.676 m)   Wt 150 lb (68 kg)   BMI 24.21 kg/m   Wt Readings from Last 3 Encounters:  06/28/19 150 lb (68 kg)  12/09/18 148 lb 8 oz (67.4 kg)  04/12/18 150 lb (68 kg)   Temp Readings from Last 3 Encounters:  03/19/18 98.7 F (37.1 C) (Oral)  03/01/16 98.3 F (36.8 C) (Oral)   BP Readings from Last 3 Encounters:  06/28/19 132/87  12/09/18 120/82  04/12/18 130/80   Pulse Readings from Last 3 Encounters:  06/28/19 84  12/09/18 75  04/12/18 64  Well nourished, well developed male in no acute distress. Constitutional:  oriented to person, place, and time. No distress.    ASSESSMENT & PLAN:    Problem List Items Addressed This Visit      Cardiology Problems   CAD (coronary artery disease), native coronary artery - Primary     Other   Tobacco abuse    Other Visit Diagnoses    Ischemic cardiomyopathy       Hyperlipidemia LDL goal <70         Chronic stable angina Echo pending [place], plavix, change crestor, stay on losartan 25  BP 120/80  Ischemic cardiomyopathy Again recommended he follow through on echocardiogram, order placed previously  Smoking We have encouraged him to continue to work on weaning his cigarettes and smoking cessation. He will continue to work on this and does not want any assistance with chantix.   Hyperlipidemia Change to crestor We have placed an order for lab work in 2 months time lipids and CMP   COVID-19 Education: The signs and symptoms of COVID-19 were discussed with the patient and how to seek care for testing (follow up with PCP or arrange E-visit).  The importance of social distancing was discussed today.  Patient Risk:   After full review of this patients clinical status, I feel that they are at least moderate risk at this time.  Time:   Today, I have spent 25 minutes with the patient with telehealth technology discussing the  cardiac and medical problems/diagnoses detailed above   Additional 10 min spent reviewing the chart prior to patient visit today   Medication Adjustments/Labs and Tests Ordered: Current medicines are reviewed at length with the patient today.  Concerns regarding medicines are outlined above.   Tests Ordered: No tests ordered   Medication Changes: No changes made   Disposition: Follow-up in 12 months   Signed, Ida Rogue, MD  [place] Office [address], [place], [place] [address]

## 2019-06-28 NOTE — Patient Instructions (Addendum)
Labs as below Covid vaccine details at the bottom    Medication Instructions:  We will stop lipitor  Start crestor 20 mg daily.  This has been called into pharmacy  If you need a refill on your cardiac medications before your next appointment, please call your pharmacy.    Lab work: We will arrange lab work in the hospital lipids and CMP To be done in 2 months time  Please go to [facility] Entr[facility] to have labs done around April 19th. Make sure not to eat or drink anything after midnight the day before except small sip of water with pills. No appointment needed and just check in at registration desk. Take enclosed papers with you when you go to have those done.    If you have labs (blood work) drawn today and your tests are completely normal, you will receive your results only by: Marland Kitchen MyChart Message (if you have MyChart) OR . A paper copy in the mail If you have any lab test that is abnormal or we need to change your treatment, we will call you to review the results.   Testing/Procedures: No new testing needed   Follow-Up: At [facility], you and your health needs are our priority.  As part of our continuing mission to provide you with exceptional heart care, we have created designated Provider Care Teams.  These Care Teams include your primary Cardiologist (physician) and Advanced Practice Providers (APPs -  Physician Assistants and Nurse Practitioners) who all work together to provide you with the care you need, when you need it.  . You will need a follow up appointment in 12 months .   Please call our office 2 months in advance to schedule this appointment.    . Providers on your designated Care Team:   . Nicolasa Ducking, NP . Eula Listen, PA-C . Marisue Ivan, PA-C  Any Other Special Instructions Will Be Listed Below (If Applicable).   COVID-19 Vaccine Information can be found at: [email or web address]  For questions related to vaccine distribution or appointments, please email [email or web address] or call [phone].

## 2019-07-10 DIAGNOSIS — U071 COVID-19: Secondary | ICD-10-CM

## 2019-07-10 HISTORY — DX: COVID-19: U07.1

## 2019-07-12 ENCOUNTER — Other Ambulatory Visit: Payer: Self-pay

## 2019-07-12 MED ORDER — CLOPIDOGREL BISULFATE 75 MG PO TABS: 75.0000 mg | ORAL_TABLET | Freq: Every day | ORAL | 3 refills | Status: DC

## 2019-11-02 IMAGING — CR DG CHEST 2V
1 series · 2 of 2 positions shown · non-contrast
Comparison: None.

CLINICAL DATA: Initial evaluation for acute midsternal chest pain.

EXAM:
CHEST - 2 VIEW

[Series 1: w chest pa · 0.14mm/px · 2 of 2 slices shown]
[im 1/2]
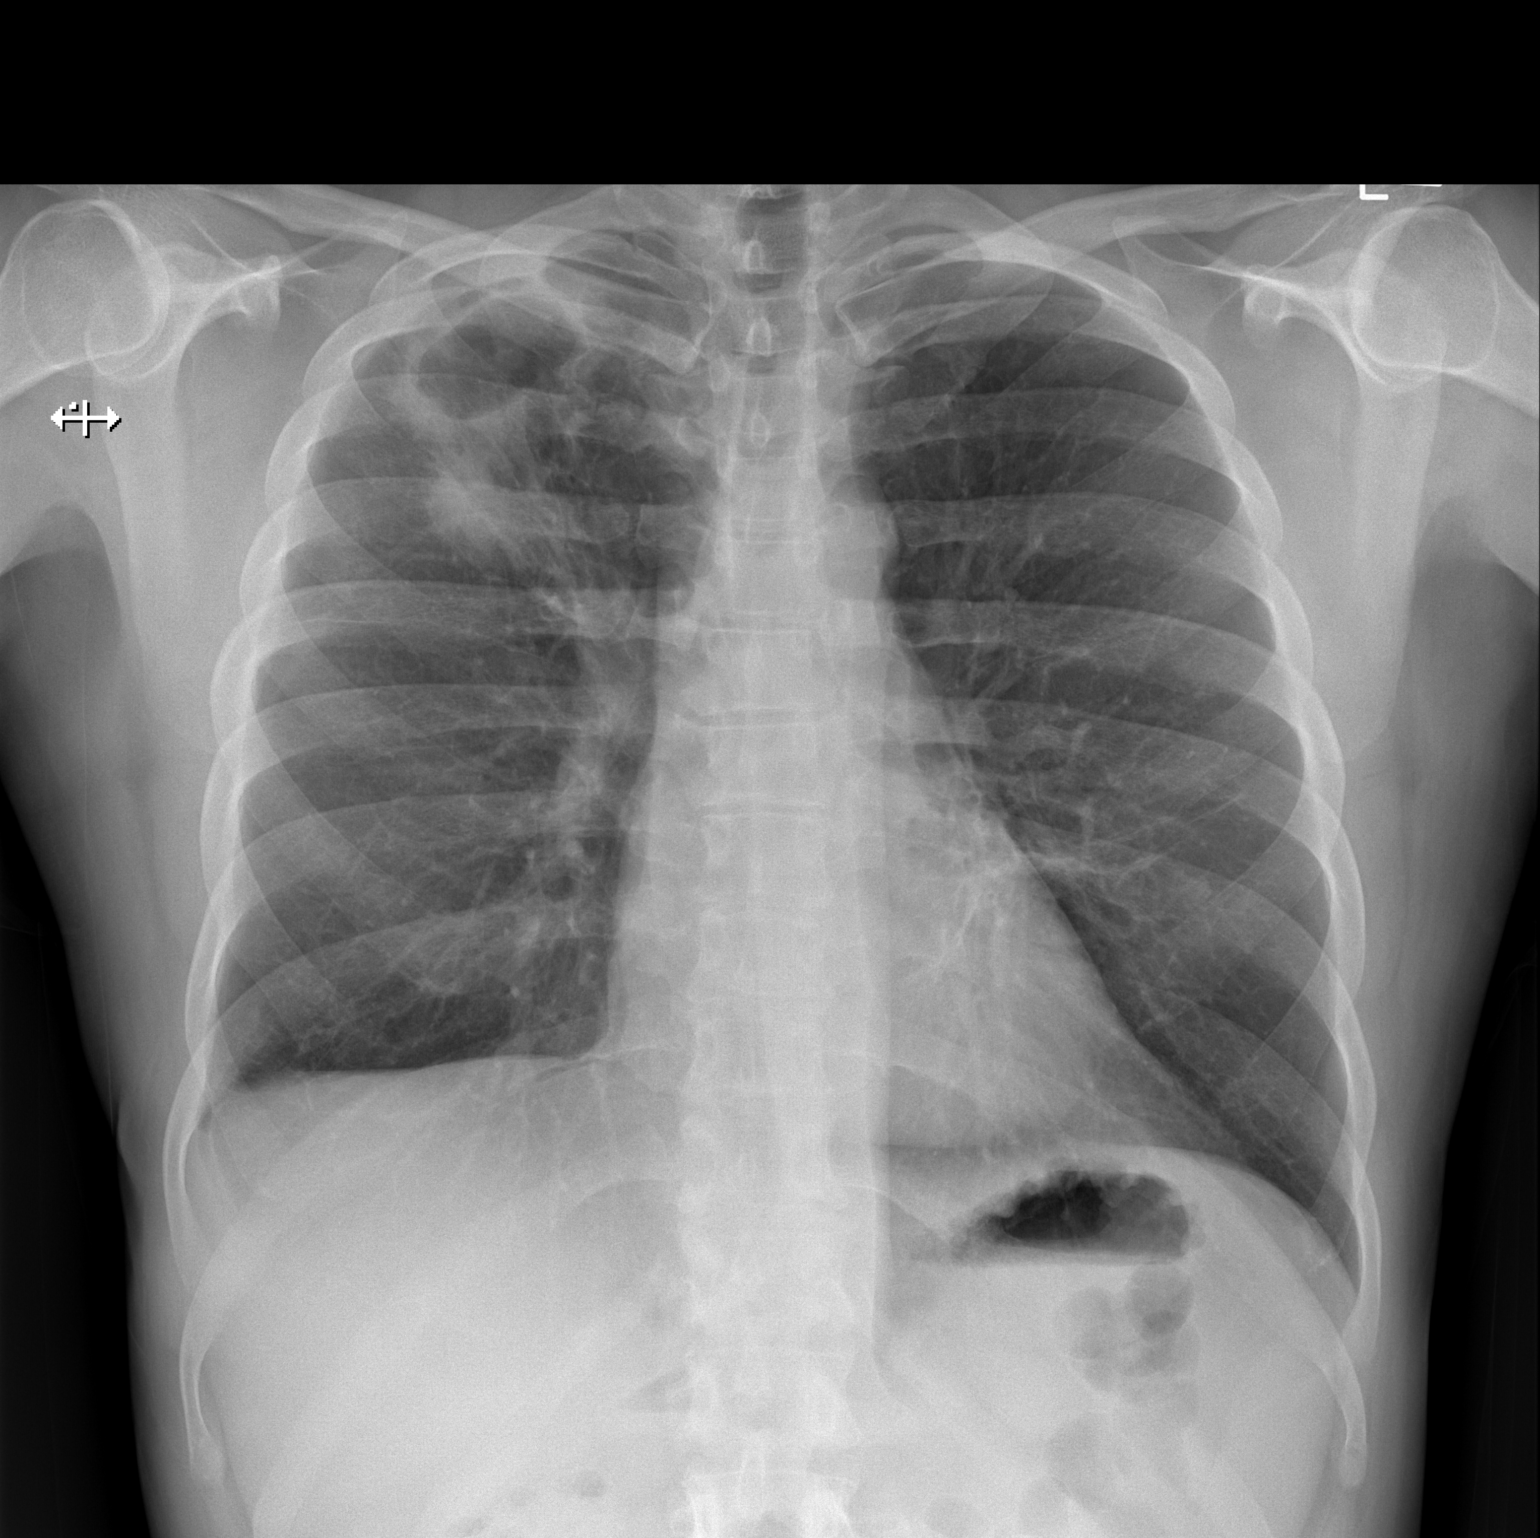
[im 2/2]
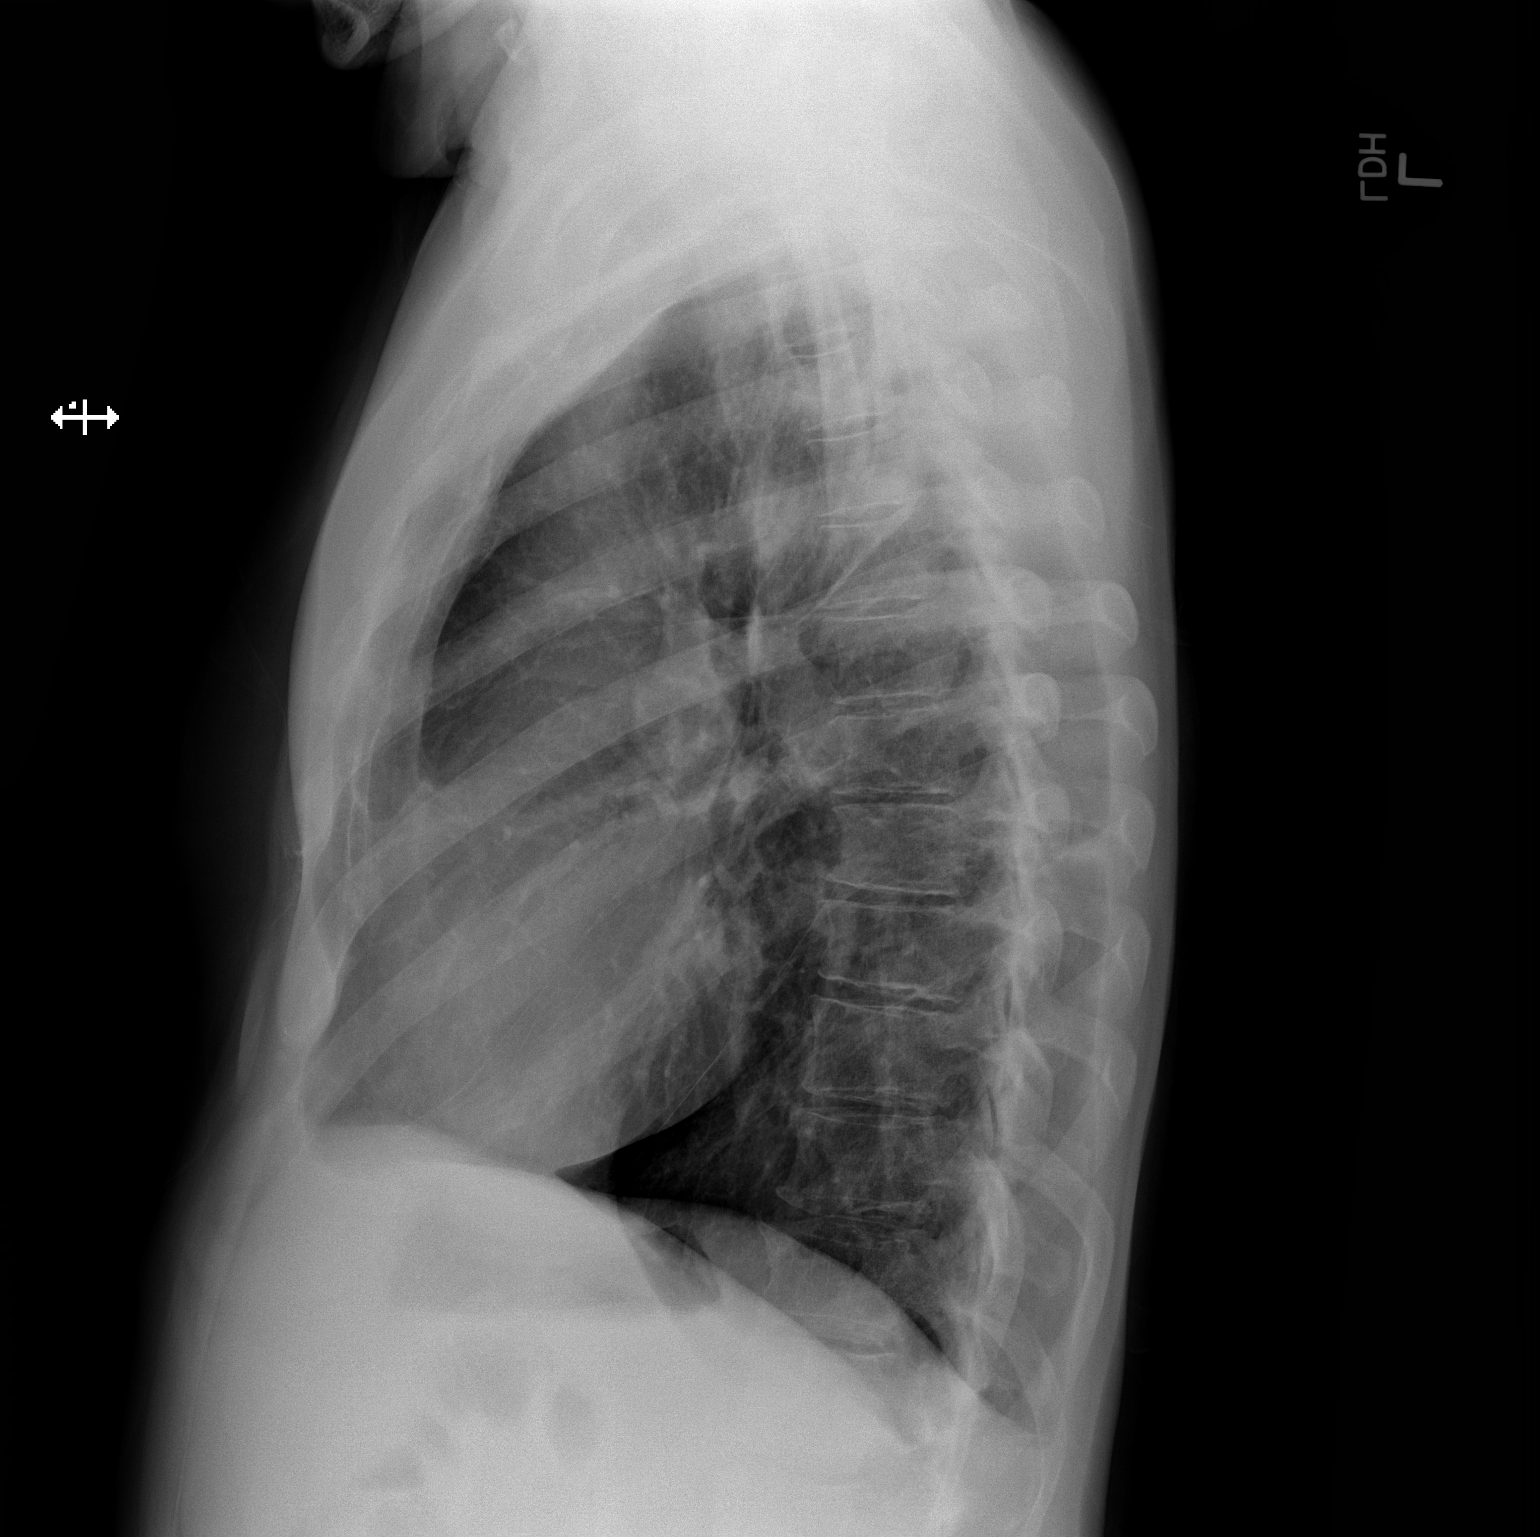

[2 of 2 positions shown; findings below may reference images not displayed]

FINDINGS: Cardiac and mediastinal silhouettes are within normal limits.

Lungs well inflated. There is an irregular nodular opacity overlying
the right suprahilar region measuring approximately 3.7 cm in size.
Cavitary change seen just distally within the right lung apex.
Irregular architectural distortion present within this region.
Irregular curvilinear scarring at the left infrahilar region lungs
are otherwise clear. No other focal airspace disease. No pulmonary
edema or definite pleural effusion. No pneumothorax.

No acute osseous abnormality.
IMPRESSION: 3.7 cm nodular density with adjacent cavitary change at the right
lung apex, indeterminate. Further assessment with dedicated
cross-sectional imaging recommended.

## 2019-11-02 IMAGING — CT CT ANGIO CHEST
2 of 6 series · 17 of 46 positions shown · IV contrast (APPLIED)
Comparison: None.

CLINICAL DATA: 51-year-old male with mid sternal and epigastric
pain since 2 p.m. today. Abnormal chest x-ray.

EXAM:
CT ANGIOGRAPHY CHEST WITH CONTRAST
TECHNIQUE: Multidetector CT imaging of the chest was performed using the
standard protocol during bolus administration of intravenous
contrast. Multiplanar CT image reconstructions and MIPs were
obtained to evaluate the vascular anatomy.
CONTRAST:  75mL S9EWZI-K2V IOPAMIDOL (S9EWZI-K2V) INJECTION 76%

[Series 5: thins · axial · 0.79mm/px · z∈[-271,+17]mm · 15 of 316 slices shown]
[im 14/316  lung]
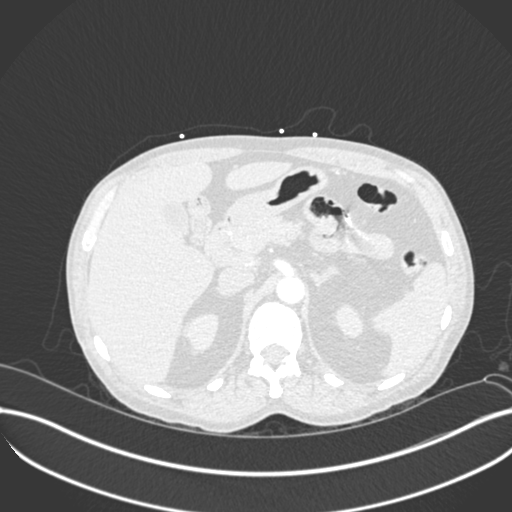
[im 42/316  soft-tissue]
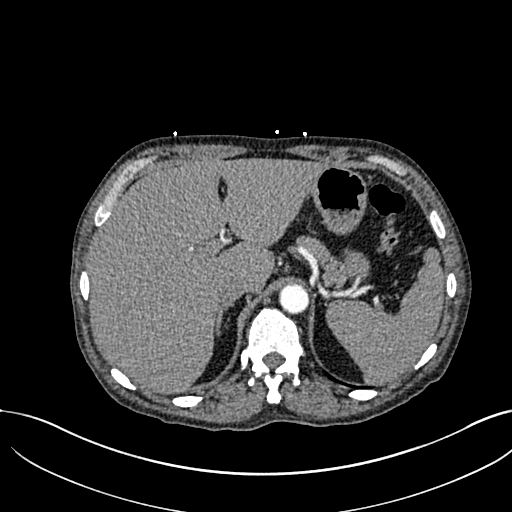
[im 55/316  lung]
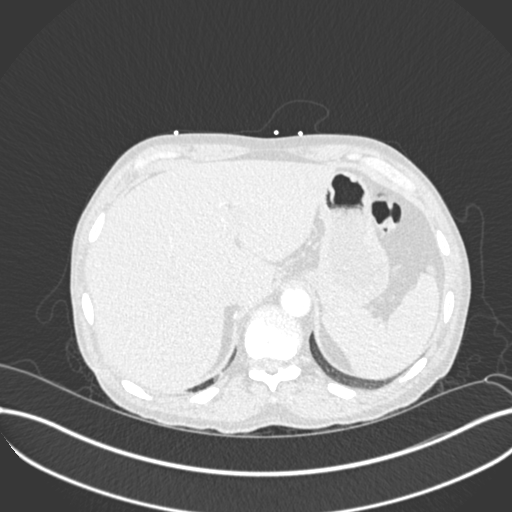
[im 83/316  soft-tissue]
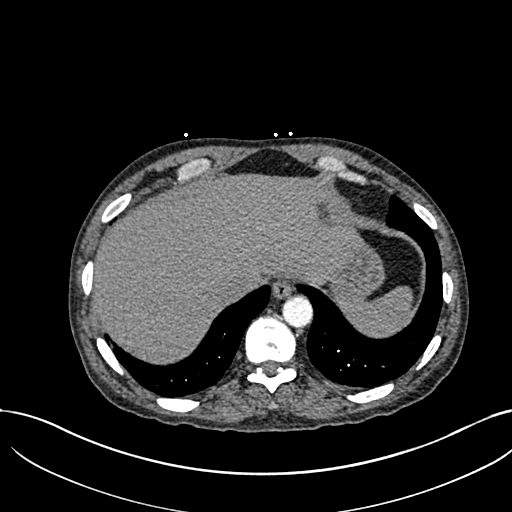
[im 96/316  lung]
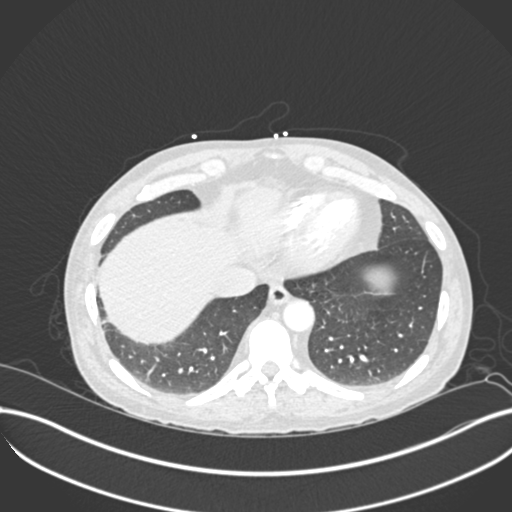
[im 124/316  soft-tissue]
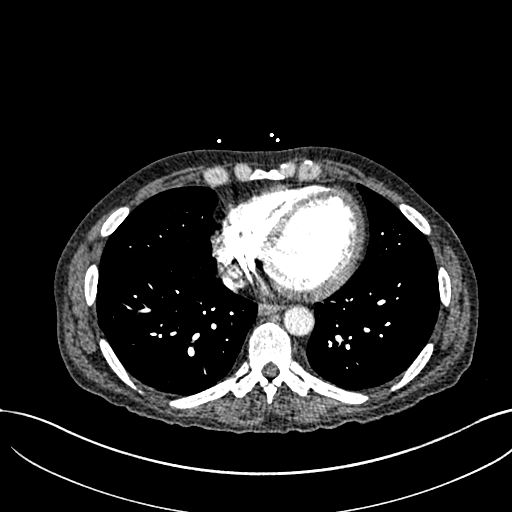
[im 137/316  lung]
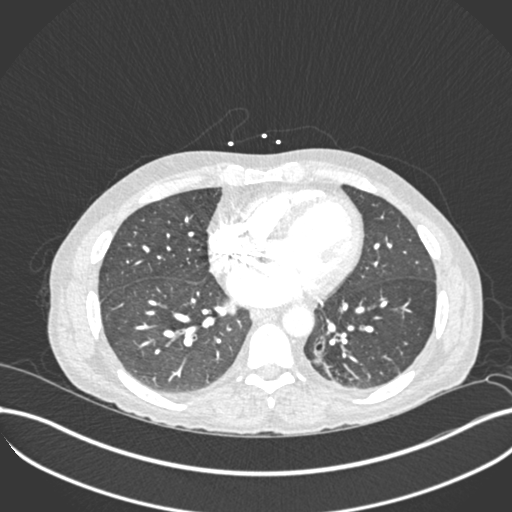
[im 165/316  soft-tissue]
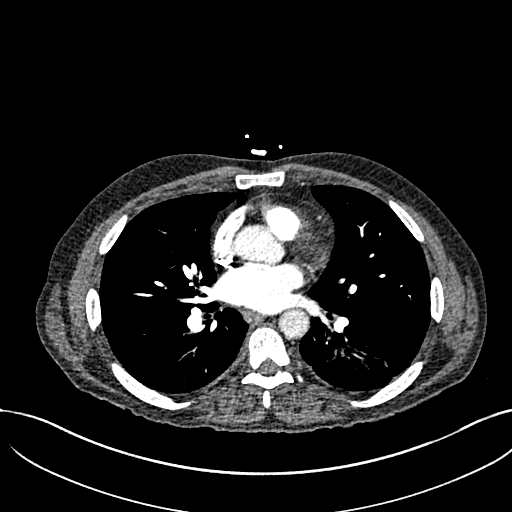
[im 179/316  lung]
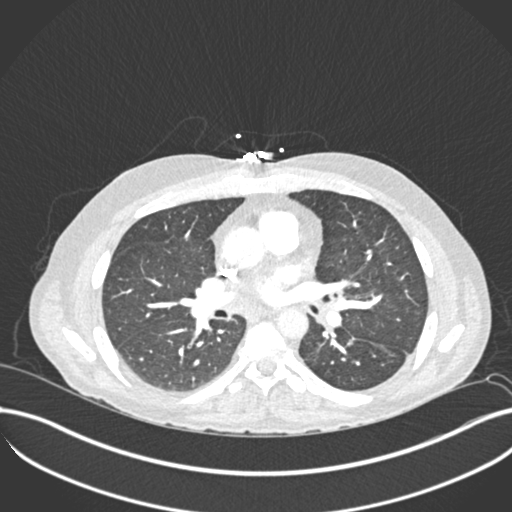
[im 192/316  soft-tissue]
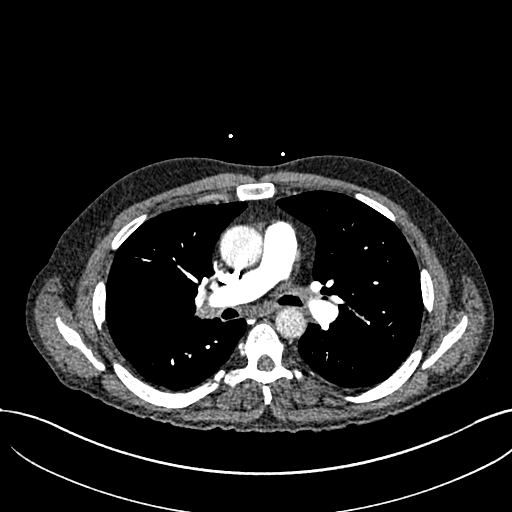
[im 220/316  lung]
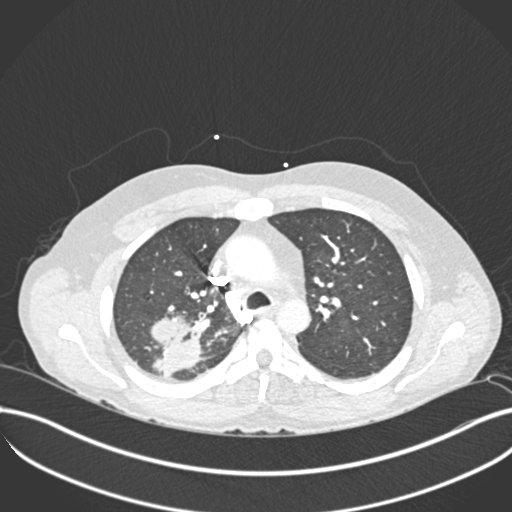
[im 233/316  soft-tissue]
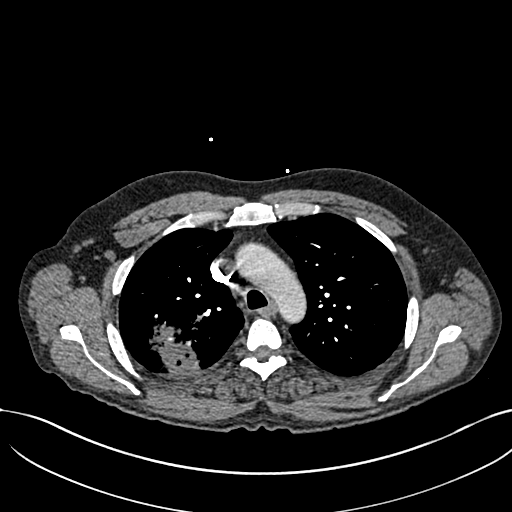
[im 261/316  lung]
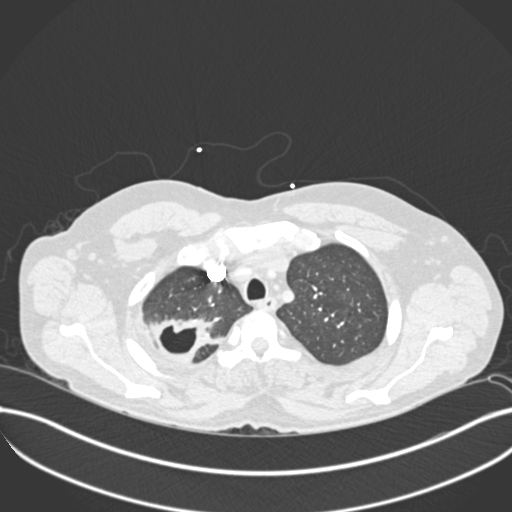
[im 274/316  soft-tissue]
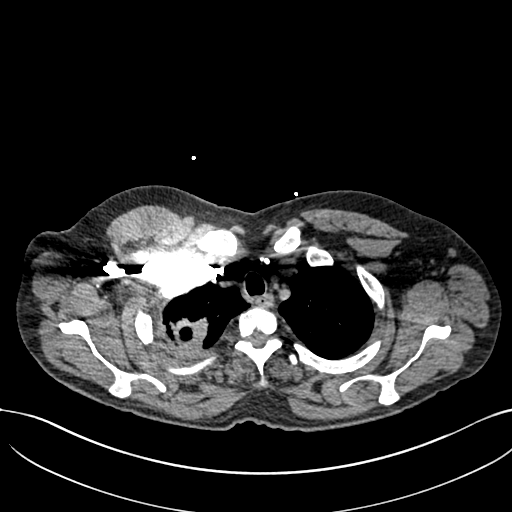
[im 302/316  lung]
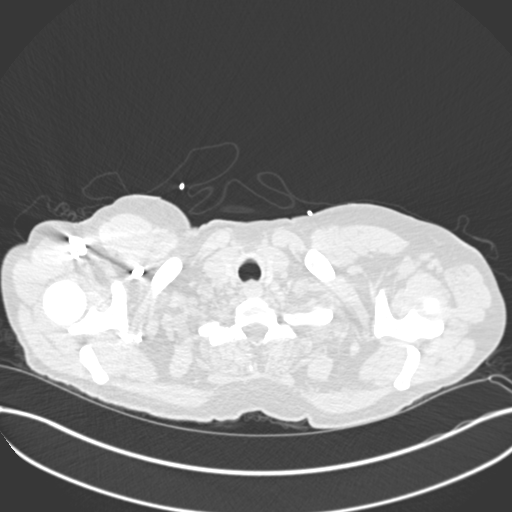

[Series 7: coronal mpr · coronal · 0.62mm/px · 2 of 75 slices shown]
[im 25/75  soft-tissue]
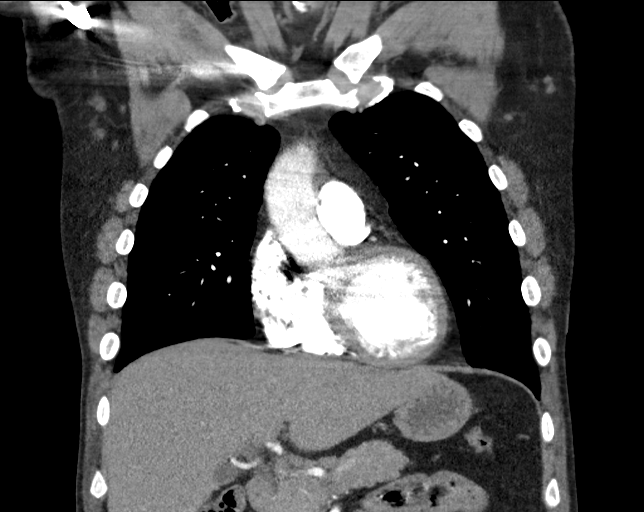
[im 50/75  soft-tissue]
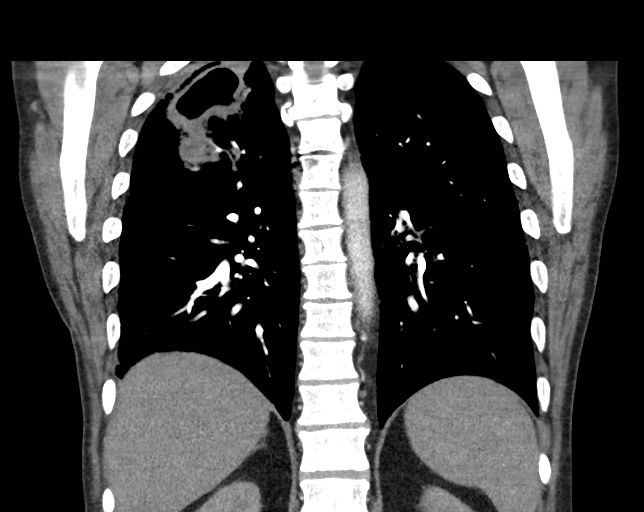

[17 of 46 positions shown; findings below may reference images not displayed]

FINDINGS: Cardiovascular: No filling defects are noted in the pulmonary
arterial tree to suggest underlying pulmonary embolism. Heart size
is normal. There is no significant pericardial fluid, thickening or
pericardial calcification. There is aortic atherosclerosis, as well
as atherosclerosis of the great vessels of the mediastinum and the
coronary arteries, including calcified atherosclerotic plaque in the
left anterior descending and left circumflex coronary arteries.

Mediastinum/Nodes: No pathologically enlarged mediastinal or hilar
lymph nodes. Right hilar lymph nodes measuring up to 9 mm in short
axis (nonspecific). Esophagus is unremarkable in appearance. No
axillary lymphadenopathy.

Lungs/Pleura: In the posterior aspect of the right upper lobe there
are multiple cavitary areas clustered in one region, which
demonstrate thick walls, concerning for multifocal cavitary
pneumonia. The possibility of cavitary mass is not excluded, but not
strongly favored. Adjacent to these lesions there are some areas of
peribronchovascular micro nodularity and ground-glass attenuation.
Focal area of scarring in the superior segment of the left lower
lobe. No pleural effusions.

Upper Abdomen: Unremarkable.

Musculoskeletal: There are no aggressive appearing lytic or blastic
lesions noted in the visualized portions of the skeleton.

Review of the MIP images confirms the above findings.
IMPRESSION: 1. Findings are favored to reflect cavitary pneumonia in the right
upper lobe, as above. Close attention after antimicrobial therapy is
recommended to ensure complete resolution of these findings, as the
possibility of a cavitary neoplasm is not excluded (but not favored
at this time).
2. No evidence of pulmonary embolism.
3. Aortic atherosclerosis, in addition to 2 vessel coronary artery
disease. Please note that although the presence of coronary artery
calcium documents the presence of coronary artery disease, the
severity of this disease and any potential stenosis cannot be
assessed on this non-gated CT examination. Assessment for potential
risk factor modification, dietary therapy or pharmacologic therapy
may be warranted, if clinically indicated.

Aortic Atherosclerosis ([id number]-KVX.X).

## 2020-04-09 ENCOUNTER — Telehealth: Payer: Self-pay | Admitting: Cardiovascular Disease

## 2020-04-09 NOTE — Telephone Encounter (Signed)
Pt c/o BP issue: STAT if pt c/o blurred vision, one-sided weakness or slurred speech  1. What are your last 5 BP readings?  This morning 161/101, states he knew yesterday his BP was high yesterday when he returned home from work, but is not sure what the reading was.   2. Are you having any other symptoms (ex. Dizziness, headache, blurred vision, passed out)? no  3. What is your BP issue? Elevated  States his PCP today told him to double his BP medication  Patient has an appointment tomorrow 12/1 with our office

## 2020-04-09 NOTE — Telephone Encounter (Signed)
This encounter was created in error - please disregard.

## 2020-04-10 ENCOUNTER — Encounter: Payer: Self-pay | Admitting: *Deleted

## 2020-04-10 ENCOUNTER — Encounter: Payer: Self-pay | Admitting: Nurse Practitioner

## 2020-04-10 ENCOUNTER — Other Ambulatory Visit: Payer: Self-pay

## 2020-04-10 ENCOUNTER — Ambulatory Visit (INDEPENDENT_AMBULATORY_CARE_PROVIDER_SITE_OTHER): Payer: Self-pay | Admitting: Nurse Practitioner

## 2020-04-10 VITALS — BP 150/88 | HR 79 | Ht 66.0 in | Wt 145.8 lb

## 2020-04-10 DIAGNOSIS — E785 Hyperlipidemia, unspecified: Secondary | ICD-10-CM

## 2020-04-10 DIAGNOSIS — I5020 Unspecified systolic (congestive) heart failure: Secondary | ICD-10-CM

## 2020-04-10 DIAGNOSIS — I10 Essential (primary) hypertension: Secondary | ICD-10-CM

## 2020-04-10 DIAGNOSIS — I255 Ischemic cardiomyopathy: Secondary | ICD-10-CM

## 2020-04-10 DIAGNOSIS — I2510 Atherosclerotic heart disease of native coronary artery without angina pectoris: Secondary | ICD-10-CM

## 2020-04-10 NOTE — Patient Instructions (Signed)
Medication Instructions:  Your physician recommends that you continue on your current medications as directed. Please refer to the Current Medication list given to you today.  *If you need a refill on your cardiac medications before your next appointment, please call your pharmacy*   Lab Work:  -Your physician recommends that you have FASTING lab work done at the [facility]:  Cmet / Fasting Lipid panel / CBC  -  Please go to the [facility]. You will check in at the front desk to the right as you walk into the atrium. Valet Parking is offered if needed.  - No appointment needed. You may go any day between 7 am and 6 pm.   If you have labs (blood work) drawn today and your tests are completely normal, you will receive your results only by: Marland Kitchen MyChart Message (if you have MyChart)  . A paper copy in the mail If you have any lab test that is abnormal or we need to change your treatment, we will call you to review the results.   Testing/Procedures: None ordered.   Follow-Up: At [facility], you and your health needs are our priority.  As part of our continuing mission to provide you with exceptional heart care, we have created designated Provider Care Teams.  These Care Teams include your primary Cardiologist (physician) and Advanced Practice Providers (APPs -  Physician Assistants and Nurse Practitioners) who all work together to provide you with the care you need, when you need it.  We recommend signing up for the patient portal called "MyChart".  Sign up information is provided on this After Visit Summary.  MyChart is used to connect with patients for Virtual Visits (Telemedicine).  Patients are able to view lab/test results, encounter notes, upcoming appointments, etc.  Non-urgent messages can be sent to your provider as well.   To learn more about what you can do with MyChart, go to [email or web address].    Your next appointment:   6 month(s)  The format for  your next appointment:   In Person  Provider:   Julien Nordmann, MD   Other Instructions   Steps to Quit Smoking Smoking tobacco is the leading cause of preventable death. It can affect almost every organ in the body. Smoking puts you and those around you at risk for developing many serious chronic diseases. Quitting smoking can be difficult, but it is one of the best things that you can do for your health. It is never too late to quit. How do I get ready to quit? When you decide to quit smoking, create a plan to help you succeed. Before you quit:  Pick a date to quit. Set a date within the next 2 weeks to give you time to prepare.  Write down the reasons why you are quitting. Keep this list in places where you will see it often.  Tell your family, friends, and co-workers that you are quitting. Support from your loved ones can make quitting easier.  Talk with your health care provider about your options for quitting smoking.  Find out what treatment options are covered by your health insurance.  Identify people, places, things, and activities that make you want to smoke (triggers). Avoid them. What first steps can I take to quit smoking?  Throw away all cigarettes at home, at work, and in your car.  Throw away smoking accessories, such as Set designer.  Clean your car. Make sure to empty the ashtray.  Clean your home, including curtains and carpets. What strategies can I use to quit smoking? Talk with your health care provider about combining strategies, such as taking medicines while you are also receiving in-person counseling. Using these two strategies together makes you more likely to succeed in quitting than if you used either strategy on its own.  If you are pregnant or breastfeeding, talk with your health care provider about finding counseling or other support strategies to quit smoking. Do not take medicine to help you quit smoking unless your health care  provider tells you to do so. To quit smoking: Quit right away  Quit smoking completely, instead of gradually reducing how much you smoke over a period of time. Research shows that stopping smoking right away is more successful than gradually quitting.  Attend in-person counseling to help you build problem-solving skills. You are more likely to succeed in quitting if you attend counseling sessions regularly. Even short sessions of 10 minutes can be effective. Take medicine You may take medicines to help you quit smoking. Some medicines require a prescription and some you can purchase over-the-counter. Medicines may have nicotine in them to replace the nicotine in cigarettes. Medicines may:  Help to stop cravings.  Help to relieve withdrawal symptoms. Your health care provider may recommend:  Nicotine patches, gum, or lozenges.  Nicotine inhalers or sprays.  Non-nicotine medicine that is taken by mouth. Find resources Find resources and support systems that can help you to quit smoking and remain smoke-free after you quit. These resources are most helpful when you use them often. They include:  Online chats with a Veterinary surgeon.  Telephone quitlines.  Printed Materials engineer.  Support groups or group counseling.  Text messaging programs.  Mobile phone apps or applications. Use apps that can help you stick to your quit plan by providing reminders, tips, and encouragement. There are many free apps for mobile devices as well as websites. Examples include Quit Guide from the [employer] and smokefree.gov What things can I do to make it easier to quit?   Reach out to your family and friends for support and encouragement. Call telephone quitlines (1-800-QUIT-NOW), reach out to support groups, or work with a counselor for support.  Ask people who smoke to avoid smoking around you.  Avoid places that trigger you to smoke, such as bars, parties, or smoke-break areas at work.  Spend time with  people who do not smoke.  Lessen the stress in your life. Stress can be a smoking trigger for some people. To lessen stress, try: ? Exercising regularly. ? Doing deep-breathing exercises. ? Doing yoga. ? Meditating. ? Performing a body scan. This involves closing your eyes, scanning your body from head to toe, and noticing which parts of your body are particularly tense. Try to relax the muscles in those areas. How will I feel when I quit smoking? Day 1 to 3 weeks Within the first 24 hours of quitting smoking, you may start to feel withdrawal symptoms. These symptoms are usually most noticeable 2-3 days after quitting, but they usually do not last for more than 2-3 weeks. You may experience these symptoms:  Mood swings.  Restlessness, anxiety, or irritability.  Trouble concentrating.  Dizziness.  Strong cravings for sugary foods and nicotine.  Mild weight gain.  Constipation.  Nausea.  Coughing or a sore throat.  Changes in how the medicines that you take for unrelated issues work in your body.  Depression.  Trouble sleeping (insomnia). Week 3 and afterward After  the first 2-3 weeks of quitting, you may start to notice more positive results, such as:  Improved sense of smell and taste.  Decreased coughing and sore throat.  Slower heart rate.  Lower blood pressure.  Clearer skin.  The ability to breathe more easily.  Fewer sick days. Quitting smoking can be very challenging. Do not get discouraged if you are not successful the first time. Some people need to make many attempts to quit before they achieve long-term success. Do your best to stick to your quit plan, and talk with your health care provider if you have any questions or concerns. Summary  Smoking tobacco is the leading cause of preventable death. Quitting smoking is one of the best things that you can do for your health.  When you decide to quit smoking, create a plan to help you succeed.  Quit  smoking right away, not slowly over a period of time.  When you start quitting, seek help from your health care provider, family, or friends. This information is not intended to replace advice given to you by your health care provider. Make sure you discuss any questions you have with your health care provider. Document Revised: 01/20/2019 Document Reviewed: 07/16/2018 Elsevier Patient Education  2020 [employer].

## 2020-04-10 NOTE — Progress Notes (Signed)
Office Visit    Patient Name: Keith Warner Date of Encounter: 04/10/2020  Primary Care Provider:  [name], NP Primary Cardiologist:  Julien Nordmann, MD  Chief Complaint    54 year-old male with a history of CAD s/p non-STEMI in 2019, tobacco abuse, and asthma who presents for follow-up related to CAD.  Past Medical History    Past Medical History:  Diagnosis Date  . Asthma   . CAD (coronary artery disease)    a. 03/2018 NSTEMI/PCI: LM nl, LAD >95p (3.0x26 Resolute Onyx DES), LCX nl, RCA dominant, nl. EF<20%.  . Cavitary pneumonia    a. 03/2018 s/p prolonged abx course.  Marland Kitchen COVID-19 virus infection 07/2019  . Essential hypertension   . HFrEF (heart failure with reduced ejection fraction) (HCC)    a. 03/2018 Echo: EF 35%.  . Hyperlipidemia LDL goal <70   . Ischemic cardiomyopathy    a. 03/2018 Echo: EF 35%, diff HK, Gr1DD, mild AI/MR. Mod dil RV.  . Tobacco abuse    Past Surgical History:  Procedure Laterality Date  . CORONARY STENT INTERVENTION N/A 03/17/2018   Procedure: CORONARY STENT INTERVENTION;  Surgeon: Iran Ouch, MD;  Location: [facility] INVASIVE CV LAB;  Service: Cardiovascular;  Laterality: N/A;  . gangrene    . LEFT HEART CATH AND CORONARY ANGIOGRAPHY N/A 03/17/2018   Procedure: LEFT HEART CATH AND CORONARY ANGIOGRAPHY poss PCI;  Surgeon: Antonieta Iba, MD;  Location: [facility] INVASIVE CV LAB;  Service: Cardiovascular;  Laterality: N/A;    Allergies  No Known Allergies  History of Present Illness   54 year old male with the above past medical history including CAD s/p non-STEMI in 2019 with DES to the pLAD, tobacco abuse, and asthma. Cardiac history dates back to November 2019 when he presented to [facility] ED with cavitary pneumonia. Troponins were elevated at the time. Echo showed an EF of 35%. In the setting of new cardiomyopathy, he underwent diagnostic catheterization, revealing severe proximal LAD disease, successfully treated with a  drug-eluting stent. He remained stable on medical therapy with aspirin, Plavix, losartan and Crestor.  He was last seen on 06/28/2019 for a routine follow-up visit via telemedicine.  He continued to smoke at the time and recommendations were made for a follow-up echocardiogram, which was not completed due to cost as the patient does not have insurance.    Since his last visit he has done well overall. This week he noted elevated home blood pressure readings on two occassions and called to schedule an appointment to "have his heart checked." He was concerned given the fact that his blood pressure usually runs in the 120s/80s at home. However, he endorses a diet high in sodium over the past week and thinks that this may have contributed to the elevated readings. He denies chest pain, palpitations, dyspnea, pnd, orthopnea, n, v, dizziness, syncope, edema, weight gain, or early satiety. He continues to smoke 1/2 ppd. 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   Home Medications    Prior to Admission medications   Medication Sig Start Date End Date Taking? Authorizing Provider  albuterol (PROVENTIL HFA;VENTOLIN HFA) 108 (90 Base) MCG/ACT inhaler Inhale 1 puff into the lungs every 6 (six) hours as needed for wheezing. 03/19/18   Alford Highland, MD  aspirin EC 81 MG EC tablet Take 1 tablet (81 mg total) by mouth daily. 03/19/18   Alford Highland, MD  clopidogrel (PLAVIX) 75 MG tablet Take 1 tablet (75 mg total)  by mouth daily with breakfast. 07/12/19   Creig Hines, NP  losartan (COZAAR) 25 MG tablet Take 1 tablet (25 mg total) by mouth daily. 06/28/19   Antonieta Iba, MD  nitroGLYCERIN (NITROSTAT) 0.4 MG SL tablet Place 1 tablet (0.4 mg total) under the tongue every 5 (five) minutes as needed for chest pain. 03/19/18   Alford Highland, MD  rosuvastatin (CRESTOR) 20 MG tablet Take 1 tablet (20 mg total) by mouth daily. 06/28/19   Antonieta Iba, MD    Review of Systems     He denies chest pain, palpitations, dyspnea, pnd, orthopnea, n, v, dizziness, syncope, edema, weight gain, or early satiety. All other systems reviewed and are otherwise negative except as noted above.  Physical Exam    VS:  BP (!) 150/88   Pulse 79   Ht 5\' 6"  (1.676 m)   Wt 145 lb 12.8 oz (66.1 kg)   SpO2 98%   BMI 23.53 kg/m ] GEN: Well nourished, well developed, in no acute distress. HEENT: normal. Neck: Supple, no JVD, carotid bruits, or masses. Cardiac: RRR, no murmurs, rubs, or gallops. No clubbing, cyanosis, edema. DP 2+ and equal bilaterally.  Respiratory:  Respirations regular and unlabored, bilateral rhonchi, cleared with cough.  GI: Soft, nontender, nondistended, BS + x 4. MS: no deformity or atrophy. Skin: warm and dry, no rash. Neuro:  Strength and sensation are intact. Psych: Normal affect.  Accessory Clinical Findings    ECG personally reviewed by me today - Sinus Arrhythmia, 77, rightward axis evidence of prior septal infarct- no acute changes.  Lab Results  Component Value Date   WBC 6.0 03/19/2018   HGB 11.2 (L) 03/19/2018   HCT 34.3 (L) 03/19/2018   MCV 95.5 03/19/2018   PLT 429 (H) 03/19/2018   Lab Results  Component Value Date   CREATININE 0.66 03/18/2018   BUN <5 (L) 03/18/2018   NA 135 03/18/2018   K 3.6 03/18/2018   CL 104 03/18/2018   CO2 25 03/18/2018   Lab Results  Component Value Date   ALT 11 03/16/2018   AST 36 03/16/2018   ALKPHOS 54 03/16/2018   BILITOT 1.0 03/16/2018   Lab Results  Component Value Date   CHOL 160 03/16/2018   HDL 35 (L) 03/16/2018   LDLCALC 106 (H) 03/16/2018   TRIG 93 03/16/2018   CHOLHDL 4.6 03/16/2018    Lab Results  Component Value Date   HGBA1C 5.9 (H) 03/16/2018    Assessment & Plan    1.  CAD:  S/p non-STEMI in 2019 with DES to the pLAD, stable on Aspirin, Plavix, Losartan, and Crestor. Echo showed an EF of 35% at time of his catheterization. He is self-pay and has not had a repeat  echocardiogram due to cost. He declines echocardiogram at this time. Additionally, he is overdue for lab work. He does not wish to have labs drawn today. Will place order for CMET, CBC and fasting lipid panel today and he will return in approximately 4 weeks to have labs drawn. Follow-up in clinic in 6 months.   2. Essential HTN: He has had two episodes of elevated blood pressure in the past week, however, he endorses a diet high in sodium coinciding with theThanksgiving holiday. He checks his blood pressure regularly at home, with readings normally in the 120s/80s. He will continue to check his BP at home and contact the clinic if he has continued elevated readings. Education provided today on proper BP monitoring technique.  If he continues to have elevated BP readings, could consider increasing Losartan to 50 mg daily. Repeat CMET pending.   3. Ischemic cardiomyopathy/HFrEF: EF 35% by echo during hospitalization in November 2019. Euvolemic on exam today. He is self-pay and has not had a repeat echocardiogram due to cost. He declines echocardiogram at this time. He was not initiated on a beta-blocker in the past due to soft blood pressures. He is hypertensive today at 150/88, however this seems to be an isolated elevated reading as he has had an increase in dietary sodium over the past week. Continue Losartan. Follow-up CMET.   4. Hyperlipidemia: LDL was 106 in 2019. He has not had repeat labs since. Continue crestor. Repeat LFTs and fasting lipid panel.   5. Tobacco abuse: Ongoing though he has cut back to 1/2 ppd. Encouraged full cessation at this time.   6. Disposition: Repeat CMET, CBC and lipid panel. Continue to monitor home blood pressure readings and report persistently elevated readings. Follow-up in clinic in 6 months.   Nicolasa Ducking, NP 04/10/2020, 5:10 PM

## 2020-04-10 NOTE — Telephone Encounter (Signed)
Left voicemail message to call back  

## 2020-04-10 NOTE — Telephone Encounter (Signed)
Spoke with patient and he reports when he got home yesterday from work his blood pressures were elevated. He did lift something heavy while at work and advised that any pain can also cause increased blood pressures. He has appointment with APP today here in office and printed out some information regarding blood pressure monitoring for his review. He was appreciative for the call back and will be here later for his appointment. He verbalized understanding of our conversation, agreement with plan, and had no further questions at this time.

## 2020-07-05 ENCOUNTER — Other Ambulatory Visit: Payer: Self-pay | Admitting: Cardiovascular Disease

## 2020-08-12 ENCOUNTER — Telehealth: Payer: Self-pay | Admitting: Nurse Practitioner

## 2020-08-12 MED ORDER — CLOPIDOGREL BISULFATE 75 MG PO TABS
75.0000 mg | ORAL_TABLET | Freq: Every day | ORAL | 1 refills | Status: DC
Start: 2020-08-12 — End: 2021-01-20

## 2020-08-12 NOTE — Telephone Encounter (Signed)
clopidogrel (PLAVIX) 75 MG tablet [[id number]]   Order Details Dose: 75 mg Route: Oral Frequency: Daily with breakfast  Dispense Quantity: 90 tablet Refills: 1       Sig: Take 1 tablet (75 mg total) by mouth daily with breakfast.      Start Date: 08/12/20 End Date: --  Written Date: 08/12/20 Expiration Date: 08/12/21  Original Order:  clopidogrel (PLAVIX) 75 MG tablet [[id number]]   Providers  Ordering and Authorizing Provider:   Creig Hines, NP  [address], [place] [address]  Phone:  [phone]  Fax:  [phone]  DEA #:  [id number]  NPI:  [id number]     Ordering User:  Jeanene Erb       Pharmacy  [phone], [place] - [address] [place], [place] [address]  Phone:  [phone] Fax:  [phone]  DEA #:  [id number]  DAW Reason: --

## 2020-10-06 ENCOUNTER — Other Ambulatory Visit: Payer: Self-pay | Admitting: Cardiovascular Disease

## 2020-10-29 ENCOUNTER — Ambulatory Visit: Payer: Self-pay | Admitting: Cardiovascular Disease

## 2020-10-29 NOTE — Progress Notes (Deleted)
Virtual Visit via Video Note   This visit type was conducted due to national recommendations for restrictions regarding the COVID-19 Pandemic (e.g. social distancing) in an effort to limit this patient's exposure and mitigate transmission in our community.  Due to his co-morbid illnesses, this patient is at least at moderate risk for complications without adequate follow up.  This format is felt to be most appropriate for this patient at this time.  All issues noted in this document were discussed and addressed.  A limited physical exam was performed with this format.  Please refer to the patient's chart for his consent to telehealth for [facility].   I connected with  Murrell Redden on 10/29/20 by a video enabled telemedicine application and verified that I am speaking with the correct person using two identifiers. I discussed the limitations of evaluation and management by telemedicine. The patient expressed understanding and agreed to proceed.   Evaluation Performed:  Follow-up visit  Date:  10/29/2020   ID:  Murrell Redden, DOB [birth year], MRN [id number]  Patient Location:  [address] [place] [address]   Provider location:   [facility], [place] office  PCP:  [name], NP  Cardiologist:  [name]  No chief complaint on file.    History of Present Illness:    ALMANDO BRAWLEY is a 55 y.o. male who presents via audio/video conferencing for a telehealth visit today.   The patient does not symptoms concerning for COVID-19 infection (fever, chills, cough, or new SHORTNESS OF BREATH).   55 year old male with a history of  tobacco abuse and asthma,  CAD, NSTEMI Severe proximal LAD disease Mr. Montesano LAD lesion noted , close to 99% 03/2018 Successfully stented by Dr. Kirke Corin cavitary pneumonia  October 2019  productive cough body aches, night sweats, and fatigue.weight loss and anorexia  [place]  in  November with   chest pain.  Chest CT showed right upper lobe cavi  Cavitary lesion Long history of smoking,   Non-STEMI Echocardiogram very concerning for anterior MI Troponin of 6 in the setting of unstable angina symptoms Cardiac catheterization yesterday noting 95 to 99% proximal LAD lesion, stent placed  Tobacco abuse Still smoking, 6 a day Cavity   "goes to work and comes home" Little exposure to groups,  BP high without pill    Prior CV studies:   The following studies were reviewed today:    Past Medical History:  Diagnosis Date   Asthma    CAD (coronary artery disease)    a. 03/2018 NSTEMI/PCI: LM nl, LAD >95p (3.0x26 Resolute Onyx DES), LCX nl, RCA dominant, nl. EF<20%.   Cavitary pneumonia    a. 03/2018 s/p prolonged abx course.   COVID-19 virus infection 07/2019   Essential hypertension    HFrEF (heart failure with reduced ejection fraction) (HCC)    a. 03/2018 Echo: EF 35%.   Hyperlipidemia LDL goal <70    Ischemic cardiomyopathy    a. 03/2018 Echo: EF 35%, diff HK, Gr1DD, mild AI/MR. Mod dil RV.   Tobacco abuse    Past Surgical History:  Procedure Laterality Date   CORONARY STENT INTERVENTION N/A 03/17/2018   Procedure: CORONARY STENT INTERVENTION;  Surgeon: Iran Ouch, MD;  Location: [facility] INVASIVE CV LAB;  Service: Cardiovascular;  Laterality: N/A;   gangrene     LEFT HEART CATH AND CORONARY ANGIOGRAPHY N/A 03/17/2018   Procedure: LEFT HEART CATH AND CORONARY ANGIOGRAPHY poss  PCI;  Surgeon: Antonieta Iba, MD;  Location: [facility] INVASIVE CV LAB;  Service: Cardiovascular;  Laterality: N/A;      Allergies:   Patient has no known allergies.   Social History   Tobacco Use   Smoking status: Former    Packs/day: 0.25    Years: 35.00    Pack years: 8.75    Types: Cigarettes    Quit date: 03/13/2018    Years since quitting: 2.6   Smokeless tobacco: Never  Vaping Use   Vaping Use: Never used  Substance Use Topics   Alcohol use: Yes   Drug use: Never      Current Outpatient Medications on File Prior to Visit  Medication Sig Dispense Refill   albuterol (PROVENTIL HFA;VENTOLIN HFA) 108 (90 Base) MCG/ACT inhaler Inhale 1 puff into the lungs every 6 (six) hours as needed for wheezing. 1 Inhaler 0   aspirin EC 81 MG EC tablet Take 1 tablet (81 mg total) by mouth daily. 30 tablet 0   clopidogrel (PLAVIX) 75 MG tablet Take 1 tablet (75 mg total) by mouth daily with breakfast. 90 tablet 1   losartan (COZAAR) 25 MG tablet TAKE 1 TABLET BY MOUTH EVERY DAY 90 tablet 0   nitroGLYCERIN (NITROSTAT) 0.4 MG SL tablet Place 1 tablet (0.4 mg total) under the tongue every 5 (five) minutes as needed for chest pain. 30 tablet 0   rosuvastatin (CRESTOR) 20 MG tablet Take 1 tablet (20 mg total) by mouth daily. 90 tablet 3   No current facility-administered medications on file prior to visit.     Family Hx: The patient's family history includes CAD in his mother.  ROS:   Please see the history of present illness.    Review of Systems  Constitutional: Negative.   HENT: Negative.    Respiratory: Negative.    Cardiovascular: Negative.   Gastrointestinal: Negative.   Musculoskeletal: Negative.   Neurological: Negative.   Psychiatric/Behavioral: Negative.    All other systems reviewed and are negative.   Labs/Other Tests and Data Reviewed:    Recent Labs: No results found for requested labs within last 8760 hours.   Recent Lipid Panel Lab Results  Component Value Date/Time   CHOL 160 03/16/2018 09:35 AM   TRIG 93 03/16/2018 09:35 AM   HDL 35 (L) 03/16/2018 09:35 AM   CHOLHDL 4.6 03/16/2018 09:35 AM   LDLCALC 106 (H) 03/16/2018 09:35 AM    Wt Readings from Last 3 Encounters:  04/10/20 145 lb 12.8 oz (66.1 kg)  06/28/19 150 lb (68 kg)  12/09/18 148 lb 8 oz (67.4 kg)     Exam:    Vital Signs: Vital signs may also be detailed in the HPI There were no vitals taken for this visit.  Wt Readings from Last 3 Encounters:  04/10/20 145 lb 12.8 oz  (66.1 kg)  06/28/19 150 lb (68 kg)  12/09/18 148 lb 8 oz (67.4 kg)   Temp Readings from Last 3 Encounters:  03/19/18 98.7 F (37.1 C) (Oral)  03/01/16 98.3 F (36.8 C) (Oral)   BP Readings from Last 3 Encounters:  04/10/20 (!) 150/88  06/28/19 132/87  12/09/18 120/82   Pulse Readings from Last 3 Encounters:  04/10/20 79  06/28/19 84  12/09/18 75     Well nourished, well developed male in no acute distress. Constitutional:  oriented to person, place, and time. No distress.    ASSESSMENT & PLAN:    Problem List Items Addressed This Visit  None Chronic stable angina Echo pending Asa, plavix, change crestor, stay on losartan 25  BP 120/80  Ischemic cardiomyopathy Again recommended he follow through on echocardiogram, order placed previously  Smoking We have encouraged him to continue to work on weaning his cigarettes and smoking cessation. He will continue to work on this and does not want any assistance with chantix.   Hyperlipidemia Change to crestor We have placed an order for lab work in 2 months time lipids and CMP   COVID-19 Education: The signs and symptoms of COVID-19 were discussed with the patient and how to seek care for testing (follow up with PCP or arrange E-visit).  The importance of social distancing was discussed today.  Patient Risk:   After full review of this patients clinical status, I feel that they are at least moderate risk at this time.  Time:   Today, I have spent 25 minutes with the patient with telehealth technology discussing the cardiac and medical problems/diagnoses detailed above   Additional 10 min spent reviewing the chart prior to patient visit today   Medication Adjustments/Labs and Tests Ordered: Current medicines are reviewed at length with the patient today.  Concerns regarding medicines are outlined above.   Tests Ordered: No tests ordered   Medication Changes: No changes made   Disposition: Follow-up in 12  months   Signed, Julien Nordmann, MD  [facility] [address], [place], [place] [address]

## 2020-10-30 ENCOUNTER — Encounter: Payer: Self-pay | Admitting: Cardiovascular Disease

## 2021-01-08 ENCOUNTER — Telehealth: Payer: Self-pay | Admitting: Cardiovascular Disease

## 2021-01-08 MED ORDER — NITROGLYCERIN 0.4 MG SL SUBL: 0.4000 mg | SUBLINGUAL_TABLET | SUBLINGUAL | 3 refills | Status: DC | PRN

## 2021-01-08 NOTE — Telephone Encounter (Signed)
Attempted to call pt x2 with mobile on file [phone] Get a message "Verlon Au sorry, you're call cannot be completed as dialed at this time..."  Was able to get Mr. McComick on his home number listed on file, pt reports he was out mowing his yard yesterday and started to have CP, took one Nitro and CP was relieved, called EMS and they came to evaluate him. Reports EMS stated his BP was a little elevated (no numbers to report) and said his EKG "look good". They advised him to go to the ED for further evaluation, but decided, stated "I will call my cardiologist to get seen, it's been a while".   Advised pt that next time he has an episode of CP, then he needs to be checked out in the ED to have additional testing that EMS cannot preform in the field like cardiac markers or radiology scans. Pt verbalized understanding.   Educated pt that it is a little concerning that he had experience CP while mowing and that Nitro relieved the pain/pressure, could mean cardiac related. Definitely needs to be evaluated and testing are probably in order at his next visit, like a stress test or ECHO, will be determine at his visit on 9/12 with Dr. Mariah Milling.  Pt needs more nitro, had washed his container on accident with laundry, the one tab he had yesterday was an "emergency tab" he kept in his lunch bag. Sent in refill [facility] N[facility]. He currently continues to take his Losartan 25 mg daily and Plavix 75 mg daily.   Advised pt that he has labs pending that were order at his last visit, encourage Mr. Charbonnet to have these completed before his appt in 2 week (CBC, CMET, & Lipids). Walk into medical mall at the check in desk, they will direct you to lab registration, hours for labs are Monday-Friday 07:00am-5:30pm (no appointment necessary). Pt verbalized understanding and will get these completed.   Otherwise all questions or concerns were address and no additional concerns at this time. Agreeable to plan, will  call back for anything further, Mr. Arizpe is thankful for the return call and recommendations.

## 2021-01-08 NOTE — Telephone Encounter (Signed)
Pt c/o of Chest Pain: STAT if CP now or developed within 24 hours  1. Are you having CP right now? no  2. Are you experiencing any other symptoms (ex. SOB, nausea, vomiting, sweating)? Coughing 3. How long have you been experiencing CP? For 15 minutes yesterday  4. Is your CP continuous or coming and going? Comes and goes  5. Have you taken Nitroglycerin? Took 1  yesterday ?

## 2021-01-19 NOTE — Progress Notes (Signed)
Date:  01/20/2021   ID:  Keith Warner, DOB 12-12-1965, MRN [id number]  Patient Location:  [address] [place] [address]   Provider location:   [facility], [place] office  PCP:  [name], NP  Cardiologist:  [name]  Chief Complaint  Patient presents with   6 month follow up     Patient c/o chest pain last week, took 1 NTG tablet and symptoms relieved. Medications reviewed by the patient verbally.    History of Present Illness:    Keith Warner is a 55 y.o. male history of  tobacco abuse , trying to quit  asthma,  CAD, NSTEMI Severe proximal LAD disease Mr. [name] LAD lesion noted , close to 99% 03/2018 Successfully stented by Dr. 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  Chest CT showed right upper lobe cavi  Cavitary lesion Long history of smoking,   Non-STEMI Echocardiogram very concerning for anterior MI Troponin of 6 in the setting of unstable angina symptoms Cardiac catheterization yesterday noting 95 to 99% proximal LAD lesion, stent placed    Past Medical History:  Diagnosis Date   Asthma    CAD (coronary artery disease)    a. 03/2018 NSTEMI/PCI: LM nl, LAD >95p (3.0x26 Resolute Onyx DES), LCX nl, RCA dominant, nl. EF<20%.   Cavitary  pneumonia    a. 03/2018 s/p prolonged abx course.   COVID-19 virus infection 07/2019   Essential hypertension    HFrEF (heart failure with reduced ejection fraction) (HCC)    a. 03/2018 Echo: EF 35%.   Hyperlipidemia LDL goal <70    Ischemic cardiomyopathy    a. 03/2018 Echo: EF 35%, diff HK, Gr1DD, mild AI/MR. Mod dil RV.   Tobacco abuse    Past Surgical History:  Procedure Laterality Date   CORONARY STENT INTERVENTION N/A 03/17/2018   Procedure: CORONARY STENT INTERVENTION;  Surgeon: Iran Ouch, MD;  Location: [facility] INVASIVE CV LAB;  Service: Cardiovascular;  Laterality: N/A;   gangrene     LEFT HEART CATH AND CORONARY ANGIOGRAPHY N/A 03/17/2018   Procedure: LEFT HEART CATH AND CORONARY ANGIOGRAPHY poss PCI;  Surgeon: Antonieta Iba, MD;  Location: [facility] INVASIVE CV LAB;  Service: Cardiovascular;  Laterality: N/A;      Allergies:   Patient has no known allergies.   Social History   Tobacco Use   Smoking status: Every Day    Packs/day: 0.25    Years: 35.00    Pack years: 8.75    Types: Cigarettes    Last attempt to quit: 03/13/2018    Years since quitting: 2.8   Smokeless tobacco: Never  Vaping Use   Vaping Use: Never used  Substance  Use Topics   Alcohol use: Yes    Alcohol/week: 12.0 standard drinks    Types: 12 Cans of beer per week   Drug use: Never     Current Outpatient Medications on File Prior to Visit  Medication Sig Dispense Refill   albuterol (PROVENTIL HFA;VENTOLIN HFA) 108 (90 Base) MCG/ACT inhaler Inhale 1 puff into the lungs every 6 (six) hours as needed for wheezing. 1 Inhaler 0   aspirin EC 81 MG EC tablet Take 1 tablet (81 mg total) by mouth daily. 30 tablet 0   clopidogrel (PLAVIX) 75 MG tablet Take 1 tablet (75 mg total) by mouth daily with breakfast. 90 tablet 1   losartan (COZAAR) 25 MG tablet TAKE 1 TABLET BY MOUTH EVERY DAY 90 tablet 0   nitroGLYCERIN (NITROSTAT) 0.4 MG SL tablet Place 1 tablet (0.4 mg total) under the tongue every 5  (five) minutes as needed for chest pain. 30 tablet 3   rosuvastatin (CRESTOR) 20 MG tablet Take 1 tablet (20 mg total) by mouth daily. 90 tablet 3   No current facility-administered medications on file prior to visit.     Family Hx: The patient's family history includes CAD in his mother.  ROS:   Please see the history of present illness.    Review of Systems  Constitutional: Negative.   HENT: Negative.    Respiratory: Negative.    Cardiovascular:  Positive for chest pain.  Gastrointestinal: Negative.   Musculoskeletal: Negative.   Neurological: Negative.   Psychiatric/Behavioral: Negative.    All other systems reviewed and are negative.   Labs/Other Tests and Data Reviewed:    Recent Labs: No results found for requested labs within last 8760 hours.   Recent Lipid Panel Lab Results  Component Value Date/Time   CHOL 160 03/16/2018 09:35 AM   TRIG 93 03/16/2018 09:35 AM   HDL 35 (L) 03/16/2018 09:35 AM   CHOLHDL 4.6 03/16/2018 09:35 AM   LDLCALC 106 (H) 03/16/2018 09:35 AM    Wt Readings from Last 3 Encounters:  01/20/21 152 lb 4 oz (69.1 kg)  04/10/20 145 lb 12.8 oz (66.1 kg)  06/28/19 150 lb (68 kg)     Exam:    Vital Signs: Vital signs may also be detailed in the HPI BP (!) 150/82 (BP Location: Left Arm, Patient Position: Sitting, Cuff Size: Normal)   Pulse 74   Ht 5\' 6"  (1.676 m)   Wt 152 lb 4 oz (69.1 kg)   SpO2 98%   BMI 24.57 kg/m   Constitutional:  oriented to person, place, and time. No distress.  HENT:  Head: Grossly normal Eyes:  no discharge. No scleral icterus.  Neck: No JVD, no carotid bruits  Cardiovascular: Regular rate and rhythm, no murmurs appreciated Pulmonary/Chest: Clear to auscultation bilaterally, no wheezes or rails Abdominal: Soft.  no distension.  no tenderness.  Musculoskeletal: Normal range of motion Neurological:  normal muscle tone. Coordination normal. No atrophy Skin: Skin warm and dry Psychiatric: normal affect,  pleasant   ASSESSMENT & PLAN:    Problem List Items Addressed This Visit       Cardiology Problems   CAD (coronary artery disease), native coronary artery - Primary   Relevant Orders   EKG 12-Lead     Other   Tobacco abuse   Other Visit Diagnoses     Ischemic cardiomyopathy       Relevant Orders   EKG 12-Lead   HFrEF (heart failure with reduced ejection fraction) (HCC)  Hyperlipidemia LDL goal <70       Essential hypertension         Chronic stable angina Reports episode of angina requiring nitroglycerin, EMTs called, did not go to the hospital He is very concerned about his symptoms, we have recommended a Myoview to rule out high risk ischemia  Ischemic cardiomyopathy Stress test ordered as above  Smoking We have encouraged him to continue to work on weaning his cigarettes and smoking cessation. He will continue to work on this and does not want any assistance with chantix.    Hyperlipidemia Continue Crestor, cholesterol at goal   Total encounter time more than 25 minutes  Greater than 50% was spent in counseling and coordination of care with the patient    Signed, Julien Nordmann, MD  [facility] [address], [place], [place] [address]

## 2021-01-20 ENCOUNTER — Encounter: Payer: Self-pay | Admitting: Cardiovascular Disease

## 2021-01-20 ENCOUNTER — Other Ambulatory Visit: Payer: Self-pay

## 2021-01-20 ENCOUNTER — Ambulatory Visit (INDEPENDENT_AMBULATORY_CARE_PROVIDER_SITE_OTHER): Payer: Self-pay | Admitting: Cardiovascular Disease

## 2021-01-20 VITALS — BP 150/82 | HR 74 | Ht 66.0 in | Wt 152.2 lb

## 2021-01-20 DIAGNOSIS — Z720 Tobacco use: Secondary | ICD-10-CM

## 2021-01-20 DIAGNOSIS — I201 Angina pectoris with documented spasm: Secondary | ICD-10-CM

## 2021-01-20 DIAGNOSIS — Z01810 Encounter for preprocedural cardiovascular examination: Secondary | ICD-10-CM

## 2021-01-20 DIAGNOSIS — I209 Angina pectoris, unspecified: Secondary | ICD-10-CM

## 2021-01-20 DIAGNOSIS — I10 Essential (primary) hypertension: Secondary | ICD-10-CM

## 2021-01-20 DIAGNOSIS — I5020 Unspecified systolic (congestive) heart failure: Secondary | ICD-10-CM

## 2021-01-20 DIAGNOSIS — I2510 Atherosclerotic heart disease of native coronary artery without angina pectoris: Secondary | ICD-10-CM

## 2021-01-20 DIAGNOSIS — E785 Hyperlipidemia, unspecified: Secondary | ICD-10-CM

## 2021-01-20 DIAGNOSIS — I255 Ischemic cardiomyopathy: Secondary | ICD-10-CM

## 2021-01-20 MED ORDER — LOSARTAN POTASSIUM 25 MG PO TABS: 25.0000 mg | ORAL_TABLET | Freq: Every day | ORAL | 3 refills | Status: DC

## 2021-01-20 MED ORDER — CLOPIDOGREL BISULFATE 75 MG PO TABS
75.0000 mg | ORAL_TABLET | Freq: Every day | ORAL | 3 refills | Status: DC
Start: 2021-01-20 — End: 2022-03-17

## 2021-01-20 MED ORDER — ROSUVASTATIN CALCIUM 20 MG PO TABS: 20.0000 mg | ORAL_TABLET | Freq: Every day | ORAL | 3 refills | Status: DC

## 2021-01-20 MED ORDER — LOSARTAN POTASSIUM 25 MG PO TABS
25.0000 mg | ORAL_TABLET | Freq: Every day | ORAL | 3 refills | Status: DC
Start: 2021-01-20 — End: 2021-01-20

## 2021-01-20 NOTE — Patient Instructions (Addendum)
Medication Instructions:  No changes  If you need a refill on your cardiac medications before your next appointment, please call your pharmacy.   Lab work: No new labs needed  Testing/Procedures: Pension scheme manager (stress test)  Follow-Up: At [facility], you and your health needs are our priority.  As part of our continuing mission to provide you with exceptional heart care, we have created designated Provider Care Teams.  These Care Teams include your primary Cardiologist (physician) and Advanced Practice Providers (APPs -  Physician Assistants and Nurse Practitioners) who all work together to provide you with the care you need, when you need it.  You will need a follow up appointment in 12 months  Providers on your designated Care Team:   Nicolasa Ducking, NP Eula Listen, PA-C Marisue Ivan, PA-C Cadence [place], [place]  COVID-19 Vaccine Information can be found at: [email or web address] For questions related to vaccine distribution or appointments, please email [email or web address] .[email or web address] or call 6675610576.   [facility] MYOVIEW Chartered loss adjuster)  Your caregiver has ordered a Stress Test with nuclear imaging. The purpose of this test is to evaluate the blood supply to your heart muscle. This procedure is referred to as a "Non-Invasive Stress Test." This is because other than having an IV started in your vein, nothing is inserted or "invades" your body. Cardiac stress tests are done to find areas of poor blood flow to the heart by determining the extent of coronary artery disease (CAD). Some patients exercise on a treadmill, which naturally increases the blood flow to your heart, while others who are  unable to walk on a treadmill due to physical limitations have a pharmacologic/chemical stress agent called Lexiscan . This medicine will mimic walking on a treadmill by temporarily increasing your coronary blood flow.   Please note: these test may take  anywhere between 2-4 hours to complete  PLEASE REPORT TO [facility] MEDICAL MALL ENTRANCE  THE VOLUNTEERS AT THE FIRST DESK WILL DIRECT YOU WHERE TO GO  Instructions regarding medication:   ____ : Hold diabetes medication morning of procedure None  ____:  Hold betablocker(s) night before procedure and morning of procedure None  ____:  Hold other medications as follows: None (no fluid pills)  How to prepare for your Myoview test:  Do not eat or drink after midnight No caffeine for 24 hours prior to test No smoking 24 hours prior to test. ALL your medication may be taken with a few sips of water.   (Except for the meds mention above) Please wear a short sleeve shirt. Comfortable pants are appropriate. No perfume, cologne or lotion.   PLEASE NOTIFY THE OFFICE AT LEAST 24 HOURS IN ADVANCE IF YOU ARE UNABLE TO KEEP YOUR APPOINTMENT.  [phone] AND  PLEASE NOTIFY NUCLEAR MEDICINE AT [facility] AT LEAST 24 HOURS IN ADVANCE IF YOU ARE UNABLE TO KEEP YOUR APPOINTMENT. [phone]

## 2021-02-04 ENCOUNTER — Encounter: Admission: RE | Admit: 2021-02-04 | Payer: Self-pay | Source: Ambulatory Visit

## 2021-02-07 ENCOUNTER — Encounter: Admission: RE | Admit: 2021-02-07 | Payer: Self-pay | Source: Ambulatory Visit

## 2021-04-05 ENCOUNTER — Other Ambulatory Visit: Payer: Self-pay | Admitting: Cardiovascular Disease

## 2022-02-12 ENCOUNTER — Telehealth: Payer: Self-pay | Admitting: Cardiovascular Disease

## 2022-02-12 ENCOUNTER — Other Ambulatory Visit: Payer: Self-pay | Admitting: Cardiovascular Disease

## 2022-02-12 NOTE — Telephone Encounter (Signed)
LVM to schedule fu appt, please schedule 

## 2022-02-12 NOTE — Telephone Encounter (Signed)
Please contact patient for 1 year f/u, last seen 01-2021.

## 2022-02-26 ENCOUNTER — Other Ambulatory Visit: Payer: Self-pay | Admitting: Cardiovascular Disease

## 2022-03-14 ENCOUNTER — Other Ambulatory Visit: Payer: Self-pay | Admitting: Cardiovascular Disease

## 2022-03-16 NOTE — Telephone Encounter (Signed)
Good Morning,   Could you please schedule a 12 month follow up? The patient was last seen by Dr. Rockey Situ on 01-20-21. Thank you so much.

## 2022-03-16 NOTE — Telephone Encounter (Signed)
LVM to schedule fu appt. Please schedule.

## 2022-03-31 ENCOUNTER — Other Ambulatory Visit: Payer: Self-pay | Admitting: Cardiovascular Disease

## 2022-06-04 ENCOUNTER — Other Ambulatory Visit: Payer: Self-pay | Admitting: Cardiovascular Disease

## 2022-06-04 NOTE — Telephone Encounter (Signed)
Please schedule F/U appt for refills. Thank you!

## 2022-06-05 NOTE — Telephone Encounter (Signed)
Pt's family member answered phone and said that he will let the patient know to call back to schedule his follow up appt.

## 2022-06-09 NOTE — Telephone Encounter (Signed)
LVM

## 2022-06-16 NOTE — Telephone Encounter (Signed)
LVM

## 2022-06-17 ENCOUNTER — Encounter: Payer: Self-pay | Admitting: Cardiovascular Disease

## 2022-06-17 NOTE — Telephone Encounter (Signed)
LVM

## 2022-07-07 ENCOUNTER — Other Ambulatory Visit: Payer: Self-pay | Admitting: Cardiovascular Disease

## 2022-07-13 ENCOUNTER — Other Ambulatory Visit: Payer: Self-pay | Admitting: Cardiovascular Disease

## 2022-11-20 ENCOUNTER — Other Ambulatory Visit: Payer: Self-pay | Admitting: Cardiovascular Disease

## 2022-11-24 ENCOUNTER — Telehealth: Payer: Self-pay | Admitting: Cardiovascular Disease

## 2022-11-24 ENCOUNTER — Other Ambulatory Visit: Payer: Self-pay | Admitting: Cardiovascular Disease

## 2022-11-24 NOTE — Telephone Encounter (Signed)
Patient needs F/U appointment for further refills. Thank you!

## 2022-11-24 NOTE — Telephone Encounter (Signed)
Left voicemail, pt is overdue for follow up appointment.

## 2022-11-26 ENCOUNTER — Telehealth: Payer: Self-pay | Admitting: Cardiovascular Disease

## 2022-11-26 NOTE — Telephone Encounter (Signed)
Left voice mail to schedule overdue follow up appointment.

## 2022-11-26 NOTE — Telephone Encounter (Signed)
Left voice mail to schedule overdue f/u appt

## 2022-11-30 ENCOUNTER — Encounter: Payer: Self-pay | Admitting: Cardiovascular Disease

## 2022-11-30 NOTE — Telephone Encounter (Signed)
Patient called 3x. Letter out sent.

## 2022-11-30 NOTE — Telephone Encounter (Signed)
Called pt, left voicemail. (3x) Unable to reach letter sent via mail

## 2022-12-27 ENCOUNTER — Other Ambulatory Visit: Payer: Self-pay | Admitting: Cardiovascular Disease

## 2023-02-21 NOTE — Progress Notes (Unsigned)
Date:  02/22/2023   ID:  Murrell Redden, DOB [birth year], MRN [id number]  Patient Location:  [address] [place] [address]   Provider location:   [facility], [place] office  PCP:  Erskine Emery, NP  Cardiologist:  [name]  Chief Complaint  Patient presents with   12 month follow up     "Doing well." Medications reviewed by the patient verbally.    History of Present Illness:    Keith Warner is a 57 y.o. 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, Doing some handyman jobs, installing some doors  Does not have primary care he reports On review of his medications, Off crestor 20 mg daily, felt drained, myalgias  No chest pain Has not been taking NTG  On prior office visit 2022 reported having episode of chest pain, Myoview was ordered but was not completed presumably as he had no insurance  Reports blood pressure running little bit high at home 140 systolic  EKG personally reviewed by myself on todays visit EKG Interpretation Date/Time:  Monday February 22 2023 11:06:39 EDT Ventricular Rate:  81 PR Interval:  176 QRS Duration:  90 QT Interval:  356 QTC Calculation: 413 R Axis:   -14  Text Interpretation: Normal sinus rhythm Normal ECG When compared with ECG of 17-Mar-2018 20:35, Criteria for Anteroseptal infarct are no longer Present T wave inversion no longer evident in Anterolateral leads QT has shortened Confirmed by Julien Nordmann ([id number]) on 02/22/2023 11:08:38 AM    October 2019  productive cough body aches, night sweats, and fatigue.weight loss and anorexia  November 2019 with  chest pain.  Chest CT showed right upper  lobe cavi  Cavitary lesion Long history of smoking,   Non-STEMI Echocardiogram very concerning for anterior MI Troponin of 6 in the setting of unstable angina symptoms Cardiac catheterization yesterday noting 95 to 99% proximal LAD lesion, stent placed   Past Medical History:  Diagnosis Date   Asthma    CAD (coronary artery disease)    a. 03/2018 NSTEMI/PCI: LM nl, LAD >95p (3.0x26 Resolute Onyx DES), LCX nl, RCA dominant, nl. EF<20%.   Cavitary pneumonia    a. 03/2018 s/p prolonged abx course.   COVID-19 virus infection 07/2019   Essential hypertension    HFrEF (heart failure with reduced ejection fraction) (HCC)    a. 03/2018 Echo: EF 35%.   Hyperlipidemia LDL goal <70    Ischemic cardiomyopathy    a. 03/2018 Echo: EF 35%, diff HK, Gr1DD, mild AI/MR. Mod dil RV.   Tobacco abuse    Past Surgical History:  Procedure Laterality Date   CORONARY STENT INTERVENTION N/A 03/17/2018   Procedure: CORONARY STENT INTERVENTION;  Surgeon: Iran Ouch, MD;  Location: [facility] INVASIVE CV LAB;  Service: Cardiovascular;  Laterality: N/A;   gangrene     LEFT HEART CATH AND CORONARY ANGIOGRAPHY N/A 03/17/2018   Procedure: LEFT HEART CATH AND CORONARY ANGIOGRAPHY poss PCI;  Surgeon: Antonieta Iba, MD;  Location: [facility] INVASIVE CV LAB;  Service: Cardiovascular;  Laterality: N/A;      Allergies:   Patient has no known allergies.  Social History   Tobacco Use   Smoking status: Former    Current packs/day: 0.00    Average packs/day: 0.3 packs/day for 35.0 years (8.8 ttl pk-yrs)    Types: Cigarettes    Start date: 03/14/1983    Quit date: 03/13/2018    Years since quitting: 4.9   Smokeless tobacco: Never   Tobacco comments:    Smokes 1/2 PPD as of 02/22/2023  Vaping Use   Vaping status: Never Used  Substance Use Topics   Alcohol use: Yes    Alcohol/week: 12.0 standard drinks of alcohol    Types: 12 Cans of beer per week   Drug use: Never     Current Outpatient Medications on  File Prior to Visit  Medication Sig Dispense Refill   albuterol (PROVENTIL HFA;VENTOLIN HFA) 108 (90 Base) MCG/ACT inhaler Inhale 1 puff into the lungs every 6 (six) hours as needed for wheezing. 1 Inhaler 0   aspirin EC 81 MG EC tablet Take 1 tablet (81 mg total) by mouth daily. 30 tablet 0   clopidogrel (PLAVIX) 75 MG tablet Take 1 tablet (75 mg total) by mouth daily with breakfast. 1ST ATTEMPT: PLEASE CALL [phone] FOR AN APPOINTMENT FOR FURTHER REFILLS 30 tablet 0   losartan (COZAAR) 25 MG tablet TAKE 1 TABLET (25 MG TOTAL) BY MOUTH DAILY. 30 tablet 0   nitroGLYCERIN (NITROSTAT) 0.4 MG SL tablet Place 1 tablet (0.4 mg total) under the tongue every 5 (five) minutes as needed for chest pain. 30 tablet 3   No current facility-administered medications on file prior to visit.     Family Hx: The patient's family history includes CAD in his mother.  ROS:   Please see the history of present illness.    Review of Systems  Constitutional: Negative.   HENT: Negative.    Respiratory: Negative.    Cardiovascular:  Positive for chest pain.  Gastrointestinal: Negative.   Musculoskeletal: Negative.   Neurological: Negative.   Psychiatric/Behavioral: Negative.    All other systems reviewed and are negative.    Labs/Other Tests and Data Reviewed:    Recent Labs: No results found for requested labs within last 365 days.   Recent Lipid Panel Lab Results  Component Value Date/Time   CHOL 160 03/16/2018 09:35 AM   TRIG 93 03/16/2018 09:35 AM   HDL 35 (L) 03/16/2018 09:35 AM   CHOLHDL 4.6 03/16/2018 09:35 AM   LDLCALC 106 (H) 03/16/2018 09:35 AM    Wt Readings from Last 3 Encounters:  02/22/23 159 lb 8 oz (72.3 kg)  01/20/21 152 lb 4 oz (69.1 kg)  04/10/20 145 lb 12.8 oz (66.1 kg)     Exam:    Vital Signs: Vital signs may also be detailed in the HPI BP (!) 142/90 (BP Location: Left Arm, Patient Position: Sitting, Cuff Size: Normal)   Pulse 81   Ht 5\' 6"  (1.676 m)   Wt 159 lb 8  oz (72.3 kg)   SpO2 98%   BMI 25.74 kg/m   Constitutional:  oriented to person, place, and time. No distress.  HENT:  Head: Grossly normal Eyes:  no discharge. No scleral icterus.  Neck: No JVD, no carotid bruits  Cardiovascular: Regular rate and rhythm, no murmurs appreciated Pulmonary/Chest: Clear to auscultation bilaterally, no wheezes or rails Abdominal: Soft.  no distension.  no tenderness.  Musculoskeletal: Normal range of motion Neurological:  normal muscle tone. Coordination normal. No atrophy Skin: Skin warm and dry Psychiatric: normal affect, pleasant   ASSESSMENT &  PLAN:    Problem List Items Addressed This Visit       Cardiology Problems   CAD (coronary artery disease), native coronary artery - Primary   Relevant Orders   EKG 12-Lead (Completed)     Other   Tobacco abuse   Other Visit Diagnoses     Mixed hyperlipidemia       Relevant Orders   EKG 12-Lead (Completed)   Ischemic cardiomyopathy       Relevant Orders   EKG 12-Lead (Completed)      Chronic stable angina Smoking cessation recommended Denies chest pain concerning for angina Nitro refilled to [facility] Recommend he decrease dose of Crestor as he has pills at home Suggest he take Crestor 10 mg rather than 20 mg, also start Zetia 10 mg daily  Ischemic cardiomyopathy Prior ejection fraction 35% in 03/2018, prior to stent placement Stent placed to proximal LAD Currently with no insurance to cover additional testing  Smoking We have encouraged him to continue to work on weaning his cigarettes and smoking cessation. He will continue to work on this and does not want any assistance with chantix.    Hyperlipidemia Restart Crestor 10 with Zetia 10  Information provided on free clinics in the area given he has no insurance  Signed, Julien Nordmann, MD  [facility] [address], [place], [place] [address]

## 2023-02-22 ENCOUNTER — Encounter: Payer: Self-pay | Admitting: Cardiovascular Disease

## 2023-02-22 ENCOUNTER — Ambulatory Visit: Payer: Self-pay | Attending: Cardiovascular Disease | Admitting: Cardiovascular Disease

## 2023-02-22 VITALS — BP 142/90 | HR 81 | Ht 66.0 in | Wt 159.5 lb

## 2023-02-22 DIAGNOSIS — I255 Ischemic cardiomyopathy: Secondary | ICD-10-CM

## 2023-02-22 DIAGNOSIS — Z720 Tobacco use: Secondary | ICD-10-CM

## 2023-02-22 DIAGNOSIS — E782 Mixed hyperlipidemia: Secondary | ICD-10-CM

## 2023-02-22 DIAGNOSIS — I25118 Atherosclerotic heart disease of native coronary artery with other forms of angina pectoris: Secondary | ICD-10-CM

## 2023-02-22 MED ORDER — EZETIMIBE 10 MG PO TABS: 10.0000 mg | ORAL_TABLET | Freq: Every day | ORAL | 3 refills | Status: AC

## 2023-02-22 MED ORDER — NITROGLYCERIN 0.4 MG SL SUBL: 0.4000 mg | SUBLINGUAL_TABLET | SUBLINGUAL | 3 refills | Status: DC | PRN

## 2023-02-22 MED ORDER — ROSUVASTATIN CALCIUM 10 MG PO TABS
10.0000 mg | ORAL_TABLET | Freq: Every day | ORAL | 3 refills | Status: AC
Start: 2023-02-22 — End: 2023-05-23

## 2023-02-22 MED ORDER — NITROGLYCERIN 0.4 MG SL SUBL: 0.4000 mg | SUBLINGUAL_TABLET | SUBLINGUAL | 3 refills | Status: AC | PRN

## 2023-02-22 MED ORDER — CLOPIDOGREL BISULFATE 75 MG PO TABS: 75.0000 mg | ORAL_TABLET | Freq: Every day | ORAL | 4 refills | Status: DC

## 2023-02-22 MED ORDER — LOSARTAN POTASSIUM 50 MG PO TABS: 50.0000 mg | ORAL_TABLET | Freq: Every day | ORAL | 4 refills | Status: DC

## 2023-02-22 NOTE — Patient Instructions (Addendum)
Medication Instructions:  Please start 1/2 crestor pill (20 mg cut in 1/2 daily) Please start zetia 10 mg daily  If you need a refill on your cardiac medications before your next appointment, please call your pharmacy.   Lab work: No new labs needed  Testing/Procedures: No new testing needed  Follow-Up: At [facility], you and your health needs are our priority.  As part of our continuing mission to provide you with exceptional heart care, we have created designated Provider Care Teams.  These Care Teams include your primary Cardiologist (physician) and Advanced Practice Providers (APPs -  Physician Assistants and Nurse Practitioners) who all work together to provide you with the care you need, when you need it.  You will need a follow up appointment in 12 months  Providers on your designated Care Team:   Nicolasa Ducking, NP Eula Listen, PA-C Cadence Fransico Michael, [place]  COVID-19 Vaccine Information can be found at: [email or web address] For questions related to vaccine distribution or appointments, please email [email or web address] or call [phone].

## 2024-04-26 ENCOUNTER — Other Ambulatory Visit: Payer: Self-pay | Admitting: Cardiovascular Disease

## 2024-05-19 ENCOUNTER — Other Ambulatory Visit: Payer: Self-pay | Admitting: Cardiovascular Disease
